# Patient Record
Sex: Female | Born: 1935 | Race: White | Hispanic: No | Smoking: Never smoker
Health system: Southern US, Community
[De-identification: ages and names within clinical notes are randomized; demographics above are authoritative.]

## PROBLEM LIST (undated history)

## (undated) DIAGNOSIS — I10 Essential (primary) hypertension: Secondary | ICD-10-CM

## (undated) DIAGNOSIS — J309 Allergic rhinitis, unspecified: Secondary | ICD-10-CM

## (undated) DIAGNOSIS — I6529 Occlusion and stenosis of unspecified carotid artery: Secondary | ICD-10-CM

## (undated) DIAGNOSIS — F0390 Unspecified dementia without behavioral disturbance: Secondary | ICD-10-CM

## (undated) DIAGNOSIS — F32A Depression, unspecified: Secondary | ICD-10-CM

## (undated) DIAGNOSIS — R0600 Dyspnea, unspecified: Secondary | ICD-10-CM

## (undated) DIAGNOSIS — I739 Peripheral vascular disease, unspecified: Secondary | ICD-10-CM

## (undated) DIAGNOSIS — M1990 Unspecified osteoarthritis, unspecified site: Secondary | ICD-10-CM

## (undated) DIAGNOSIS — F419 Anxiety disorder, unspecified: Secondary | ICD-10-CM

## (undated) DIAGNOSIS — F329 Major depressive disorder, single episode, unspecified: Secondary | ICD-10-CM

## (undated) DIAGNOSIS — J8410 Pulmonary fibrosis, unspecified: Secondary | ICD-10-CM

## (undated) DIAGNOSIS — E46 Unspecified protein-calorie malnutrition: Secondary | ICD-10-CM

## (undated) DIAGNOSIS — E785 Hyperlipidemia, unspecified: Secondary | ICD-10-CM

## (undated) DIAGNOSIS — K219 Gastro-esophageal reflux disease without esophagitis: Secondary | ICD-10-CM

## (undated) DIAGNOSIS — M069 Rheumatoid arthritis, unspecified: Secondary | ICD-10-CM

## (undated) DIAGNOSIS — M353 Polymyalgia rheumatica: Secondary | ICD-10-CM

## (undated) DIAGNOSIS — G629 Polyneuropathy, unspecified: Secondary | ICD-10-CM

## (undated) DIAGNOSIS — E559 Vitamin D deficiency, unspecified: Secondary | ICD-10-CM

## (undated) DIAGNOSIS — M6281 Muscle weakness (generalized): Secondary | ICD-10-CM

## (undated) DIAGNOSIS — K52831 Collagenous colitis: Secondary | ICD-10-CM

## (undated) DIAGNOSIS — H8110 Benign paroxysmal vertigo, unspecified ear: Secondary | ICD-10-CM

## (undated) DIAGNOSIS — L8990 Pressure ulcer of unspecified site, unspecified stage: Secondary | ICD-10-CM

## (undated) DIAGNOSIS — M8580 Other specified disorders of bone density and structure, unspecified site: Secondary | ICD-10-CM

---

## 1998-09-18 ENCOUNTER — Other Ambulatory Visit: Admission: RE | Admit: 1998-09-18 | Discharge: 1998-09-18 | Payer: Medicare PPO | Admitting: Family Medicine

## 1998-10-17 ENCOUNTER — Ambulatory Visit (HOSPITAL_COMMUNITY): Admission: RE | Admit: 1998-10-17 | Discharge: 1998-10-17 | Payer: Self-pay | Admitting: *Deleted

## 1999-10-17 ENCOUNTER — Other Ambulatory Visit: Admission: RE | Admit: 1999-10-17 | Discharge: 1999-10-17 | Payer: Self-pay | Admitting: Family Medicine

## 2002-02-10 ENCOUNTER — Encounter: Payer: Self-pay | Admitting: Family Medicine

## 2002-02-10 ENCOUNTER — Ambulatory Visit (HOSPITAL_COMMUNITY): Admission: RE | Admit: 2002-02-10 | Discharge: 2002-02-10 | Payer: Self-pay | Admitting: Cardiology

## 2002-03-01 ENCOUNTER — Encounter: Payer: Self-pay | Admitting: Critical Care Medicine

## 2002-03-03 ENCOUNTER — Ambulatory Visit (HOSPITAL_COMMUNITY): Admission: RE | Admit: 2002-03-03 | Discharge: 2002-03-03 | Payer: Self-pay | Admitting: Critical Care Medicine

## 2002-03-03 ENCOUNTER — Encounter: Payer: Self-pay | Admitting: Critical Care Medicine

## 2002-03-11 ENCOUNTER — Ambulatory Visit: Admission: RE | Admit: 2002-03-11 | Discharge: 2002-03-11 | Payer: Self-pay | Admitting: Critical Care Medicine

## 2002-03-11 ENCOUNTER — Encounter: Payer: Self-pay | Admitting: Critical Care Medicine

## 2002-04-05 ENCOUNTER — Encounter: Payer: Self-pay | Admitting: Critical Care Medicine

## 2005-02-10 ENCOUNTER — Ambulatory Visit: Payer: Self-pay | Admitting: Critical Care Medicine

## 2005-08-26 ENCOUNTER — Ambulatory Visit: Payer: Self-pay | Admitting: Physical Medicine & Rehabilitation

## 2005-08-26 ENCOUNTER — Inpatient Hospital Stay (HOSPITAL_COMMUNITY): Admission: RE | Admit: 2005-08-26 | Discharge: 2005-09-01 | Payer: Self-pay | Admitting: Orthopaedic Surgery

## 2005-09-01 ENCOUNTER — Inpatient Hospital Stay
Admission: RE | Admit: 2005-09-01 | Discharge: 2005-09-09 | Payer: Self-pay | Admitting: Physical Medicine & Rehabilitation

## 2005-12-17 ENCOUNTER — Ambulatory Visit: Payer: Self-pay | Admitting: Physical Medicine & Rehabilitation

## 2005-12-17 ENCOUNTER — Inpatient Hospital Stay (HOSPITAL_COMMUNITY): Admission: RE | Admit: 2005-12-17 | Discharge: 2005-12-22 | Payer: Self-pay | Admitting: Orthopedic Surgery

## 2005-12-22 ENCOUNTER — Inpatient Hospital Stay
Admission: RE | Admit: 2005-12-22 | Discharge: 2005-12-27 | Payer: Self-pay | Admitting: Physical Medicine & Rehabilitation

## 2006-07-01 ENCOUNTER — Ambulatory Visit: Payer: Self-pay | Admitting: Orthopedic Surgery

## 2006-07-01 ENCOUNTER — Inpatient Hospital Stay (HOSPITAL_COMMUNITY): Admission: AD | Admit: 2006-07-01 | Discharge: 2006-07-08 | Payer: Self-pay | Admitting: Orthopedic Surgery

## 2006-07-30 ENCOUNTER — Ambulatory Visit: Payer: Self-pay | Admitting: Infectious Diseases

## 2006-10-02 ENCOUNTER — Ambulatory Visit: Payer: Self-pay | Admitting: Infectious Diseases

## 2006-10-02 LAB — CONVERTED CEMR LAB
ALT: 18 units/L (ref 0–40)
AST: 20 units/L (ref 0–37)
Albumin: 4 g/dL (ref 3.5–5.2)
Alkaline Phosphatase: 97 units/L (ref 39–117)
BUN: 36 mg/dL — ABNORMAL HIGH (ref 6–23)
Basophils Absolute: 0 10*3/uL (ref 0.0–0.1)
Basophils percent auto: 0 % (ref 0–1)
CO2: 21 meq/L (ref 19–32)
Calcium: 9.3 mg/dL (ref 8.4–10.5)
Chloride: 106 meq/L (ref 96–112)
Creatinine, Ser: 0.91 mg/dL (ref 0.40–1.20)
Eosinophils Absolute: 0.1 cells/mcL (ref 0.0–0.7)
Eosinophils Relative: 2 % (ref 0–5)
Glucose, Bld: 112 mg/dL — ABNORMAL HIGH (ref 70–99)
HCT: 41.3 % (ref 36.0–46.0)
Hemoglobin: 13.4 g/dL (ref 12.0–15.0)
Leukocyte count, blood: 6.9 10*9/L (ref 4.0–10.5)
Lymphocytes Relative: 17 % (ref 12–46)
Lymphs Abs: 1.2 10*3/uL (ref 0.7–3.3)
MCHC: 32.4 g/dL (ref 30.0–36.0)
MCV: 86.2 fL (ref 78.0–100.0)
Monocytes Absolute: 0.6 10*3/uL (ref 0.2–0.7)
Monocytes Relative: 8 % (ref 3–11)
Neutro Abs: 5 10*3/uL (ref 1.7–7.7)
Neutrophils Relative %: 73 % (ref 43–77)
Platelets: 206 10*3/uL (ref 150–400)
Potassium: 4.6 meq/L (ref 3.5–5.3)
RBC: 4.79 M/uL (ref 3.87–5.11)
RDW: 14.8 % — ABNORMAL HIGH (ref 11.5–14.0)
Sed Rate: 8 mm/hr (ref 0–22)
Sodium: 139 meq/L (ref 135–145)
Total Bilirubin: 0.4 mg/dL (ref 0.3–1.2)
Total Protein: 6.5 g/dL (ref 6.0–8.3)

## 2007-01-06 ENCOUNTER — Encounter: Payer: Self-pay | Admitting: Infectious Diseases

## 2009-07-06 ENCOUNTER — Inpatient Hospital Stay (HOSPITAL_COMMUNITY): Admission: EM | Admit: 2009-07-06 | Discharge: 2009-07-16 | Payer: Self-pay | Admitting: Emergency Medicine

## 2009-07-10 ENCOUNTER — Encounter (INDEPENDENT_AMBULATORY_CARE_PROVIDER_SITE_OTHER): Payer: Self-pay | Admitting: Gastroenterology

## 2009-10-17 ENCOUNTER — Encounter: Payer: Self-pay | Admitting: Pulmonary Disease

## 2010-05-02 ENCOUNTER — Encounter: Payer: Self-pay | Admitting: Pulmonary Disease

## 2010-05-07 ENCOUNTER — Encounter: Payer: Self-pay | Admitting: Pulmonary Disease

## 2010-05-07 ENCOUNTER — Ambulatory Visit (HOSPITAL_COMMUNITY): Admission: RE | Admit: 2010-05-07 | Discharge: 2010-05-07 | Payer: Self-pay | Admitting: Internal Medicine

## 2010-05-14 ENCOUNTER — Encounter: Payer: Self-pay | Admitting: Pulmonary Disease

## 2010-05-31 DIAGNOSIS — E559 Vitamin D deficiency, unspecified: Secondary | ICD-10-CM | POA: Insufficient documentation

## 2010-05-31 DIAGNOSIS — E785 Hyperlipidemia, unspecified: Secondary | ICD-10-CM | POA: Insufficient documentation

## 2010-05-31 DIAGNOSIS — I10 Essential (primary) hypertension: Secondary | ICD-10-CM | POA: Insufficient documentation

## 2010-05-31 DIAGNOSIS — I6529 Occlusion and stenosis of unspecified carotid artery: Secondary | ICD-10-CM

## 2010-05-31 DIAGNOSIS — K219 Gastro-esophageal reflux disease without esophagitis: Secondary | ICD-10-CM | POA: Insufficient documentation

## 2010-05-31 DIAGNOSIS — M069 Rheumatoid arthritis, unspecified: Secondary | ICD-10-CM | POA: Insufficient documentation

## 2010-05-31 DIAGNOSIS — M949 Disorder of cartilage, unspecified: Secondary | ICD-10-CM

## 2010-05-31 DIAGNOSIS — F411 Generalized anxiety disorder: Secondary | ICD-10-CM | POA: Insufficient documentation

## 2010-05-31 DIAGNOSIS — J309 Allergic rhinitis, unspecified: Secondary | ICD-10-CM | POA: Insufficient documentation

## 2010-05-31 DIAGNOSIS — M899 Disorder of bone, unspecified: Secondary | ICD-10-CM | POA: Insufficient documentation

## 2010-06-03 ENCOUNTER — Ambulatory Visit: Payer: Self-pay | Admitting: Pulmonary Disease

## 2010-06-03 DIAGNOSIS — R05 Cough: Secondary | ICD-10-CM | POA: Insufficient documentation

## 2010-06-03 DIAGNOSIS — J8410 Pulmonary fibrosis, unspecified: Secondary | ICD-10-CM

## 2010-06-05 ENCOUNTER — Encounter: Payer: Self-pay | Admitting: Pulmonary Disease

## 2011-01-07 NOTE — Progress Notes (Signed)
Summary: [facility]   Imported By: Sherian Rein 06/06/2010 08:59:29  _____________________________________________________________________  External Attachment:    Type:   Image     Comment:   External Document

## 2011-01-07 NOTE — Letter (Signed)
Summary: [facility]  [facility]   Imported By: Sherian Rein 06/14/2010 09:46:19  _____________________________________________________________________  External Attachment:    Type:   Image     Comment:   External Document

## 2011-01-07 NOTE — Miscellaneous (Signed)
Summary: received CT Chest disc from [facility]  Clinical Lists Changes    received disc of pt's CT chest from [facility] and put in your very important look at folder for you to review.  Aundra Millet Reynolds LPN  June 05, 2010 4:14 PM   Appended Document: received CT Chest disc from [facility] ct reviewed.  the pt has very mild subpleural ISLD, and given her pfts with only mild abnormalities, I suspect this is not a major contributor to her doe or cough.  Will followup in 6mos to check on her progress. will call pt and discuss.  Appended Document: received CT Chest disc from [facility] discussed with pt...she knows to f/u in 6mos.

## 2011-01-07 NOTE — Progress Notes (Signed)
Summary: [facility]   Imported By: Sherian Rein 06/06/2010 08:57:39  _____________________________________________________________________  External Attachment:    Type:   Image     Comment:   External Document

## 2011-01-07 NOTE — Letter (Signed)
Summary: [facility]   Imported By: Sherian Rein 06/14/2010 09:47:20  _____________________________________________________________________  External Attachment:    Type:   Image     Comment:   External Document

## 2011-01-07 NOTE — Progress Notes (Signed)
Summary: [facility]   Imported By: Sherian Rein 06/06/2010 08:58:32  _____________________________________________________________________  External Attachment:    Type:   Image     Comment:   External Document

## 2011-01-07 NOTE — Assessment & Plan Note (Signed)
Summary: consult for cough and abnormal ct chest.   Copy to:  Buren Kos Primary Provider/Referring Provider:  Buren Kos  CC:  Pulmonary Consult.  History of Present Illness: The pt is an 77y/o female who I have been asked to see for cough and an abnormal ct chest.  She has been seen in 2003 by Dr Delford Field for upper airway issues related to GERD and AR, and treated for possible asthma without improvement.  She had a cxr/ct at this time which showed very mild fibrotic changes, and ANA/RF/ESR were all normal at that time.  Her pfts during this time showed no obstruction, no restriction, and a mild DLCO abnl.  All of her data has been reviewed from 2003.  The pt states that she has really had a chronic dry cough since that time, and can occasionally produce mucus that is clear in the evenings.  She is having ongoing postnasal drip, and has ongoing GERD which she thinks is well controlled on meds.  Again, inhalers in the past did nothing for the cough, and in fact she thinks worsened.  She has chronic doe which she thinks is stable from one year ago, and wonders if it is due to her age and debility.   She has had a recent ct chest which showed by report lower lobe predominant pulmonary fibrosis.  Her recent pfts showed no obstruction by spirometry, but did have mild airtrapping that mildly improved with bronchodilators.  Surprisingly, there was no restriction, and only mild reduction in DLCO that was similar to 2003.    Current Medications (verified): 1)  Hyzaar 100-25 Mg Tabs (Losartan Potassium-Hctz) .... Take 1 Tablet By Mouth Once A Day 2)  Norvasc 10 Mg Tabs (Amlodipine Besylate) .... Take 1 Tablet By Mouth Once A Day 3)  Zetia 10 Mg Tabs (Ezetimibe) .... Take 1 Tablet By Mouth Once A Day 4)  Vitamin D 1000 Unit Tabs (Cholecalciferol) .... Take 1 Tablet By Mouth Two Times A Day 5)  Aspirin 325 Mg Tabs (Aspirin) .... Take 1 Tablet By Mouth Once A Day 6)  Prevacid 24hr 15 Mg Cpdr (Lansoprazole)  .... Take 1 Tablet By Mouth Two Times A Day 7)  Aleve 220 Mg Tabs (Naproxen Sodium) .... Take 1 Tablet By Mouth Two Times A Day 8)  Xanax 0.5 Mg Tabs (Alprazolam) .... Take 1 Tablet By Mouth Three Times A Day As Needed 9)  Trazodone Hcl 100 Mg Tabs (Trazodone Hcl) .... Take 1 Tab By Mouth At Bedtime 10)  Cephalexin 500 Mg Caps (Cephalexin) .... Take 1 Tablet By Mouth Once A Day 11)  Vicodin 5-500 Mg Tabs (Hydrocodone-Acetaminophen) .... Take Every 6 Hours As Needed For Pain 12)  Levocetirizine Dihydrochloride 5 Mg Tabs (Levocetirizine Dihydrochloride) .... Take 1 Tablet By Mouth Once A Day As Needed  Allergies (verified): No Known Drug Allergies  Past History:  Past Medical History:  CAROTID STENOSIS (ICD-433.10) VITAMIN D DEFICIENCY (ICD-268.9) OSTEOPENIA (ICD-733.90) HYPERLIPIDEMIA (ICD-272.4) ALLERGIC RHINITIS (ICD-477.9) GERD (ICD-530.81) ANXIETY (ICD-300.00) HYPERTENSION (ICD-401.9) ARTHRITIS, RHEUMATOID (ICD-714.0) H/o collagenous colitis    Past Surgical History: R knee replacement  x 3 L knee replacement 2006 hysterectomy 1978 cervical spine fusion 1985 L eye surgery 1960 stripped veins in B legs 1960s tonsillectomy  Family History: Reviewed history from 05/31/2010 and no changes required. father deceased at age 30 MI mother deceased at age 30 CVA brother deceased at age 10 MI 5 sisters son deceaed at age 47 Non-Hodgkins Lymphoma son with aortic "clot" daughter with  RA 1 son and daughter healthy  Social History: Reviewed history from 05/31/2010 and no changes required. divorced widow daughter and son live with her pt ha 5 children (1 deceased) 4 grandchildren some community college retired Agricultural consultant.  never smoker.   Review of Systems       The patient complains of shortness of breath with activity, productive cough, non-productive cough, loss of appetite, and anxiety.  The patient denies shortness of breath at rest, coughing  up blood, chest pain, irregular heartbeats, acid heartburn, indigestion, weight change, abdominal pain, difficulty swallowing, sore throat, tooth/dental problems, headaches, nasal congestion/difficulty breathing through nose, sneezing, itching, ear ache, depression, hand/feet swelling, joint stiffness or pain, rash, change in color of mucus, and fever.    Vital Signs:  Patient profile:   76 year old female Height:      62 inches Weight:      168 pounds BMI:     30.84 O2 Sat:      96 % on Room air Temp:     98.4 degrees F oral Pulse rate:   103 / minute BP sitting:   130 / 78  (right arm) Cuff size:   regular  Vitals Entered By: Arman Filter LPN (June 03, 2010 1:50 PM)  O2 Flow:  Room air CC: Pulmonary Consult Comments Medications reviewed with patient Arman Filter LPN  June 03, 2010 1:50 PM    Physical Exam  General:  ow female in nad Eyes:  PERRLA and EOMI.   Nose:  patent without discharge Mouth:  clear Neck:  no jvd, tmg, LN Lungs:  bibasilar crackles 1/3 up bilat, no wheezing or rhonchi Heart:  rrr, no mrg Abdomen:  soft and nontender, bs+ Extremities:  2+ edema left worse than right, venous stasis changes, no cyanosis pulses intact distally but diminished Neurologic:  alert and oriented, moves all 4.   Impression & Recommendations:  Problem # 1:  PULMONARY FIBROSIS, CHRONIC (ICD-515) the pt has a h/o some degree of ISLD dating back to 2003.  Her recent ct report describes the classic findings of UIP, but I will need to review for myself once disc available.  The most likely etiology for this is her RA, and the question is whether it is worth putting her thru more aggressive treatment.  She is frail and debilitated, and her pfts are surprisingly normal except for a mild decrease in dlco.  The pt has chronic doe, and tells me that she does not think it is any worse than one year ago.  At this point, I would like to review her ct chest, then discuss with her further.  My  inclination however, is to just follow this to see if she has progressive pulmonary symptoms, radiographs, or pfts.  Problem # 2:  COUGH, CHRONIC (ICD-786.2) this has been a longstanding issue for her of many years duration.  She clearly has issues with postnasal drip and reflux, as well as a hypersensitized upper airway.  She did not respond to treatment for possible asthma, and her current pfts show minimal obstruction primarily manifested as airtrapping ( I suspect this is senile emphysema).  Will try on more aggressive treatment for postnasal drip and see if she sees improvement.  Certainly ISLD can cause cough, but typically when it is more advanced.  Will be able to comment on this once current ct scan reviewed.  Medications Added to Medication List This Visit: 1)  Xanax 0.5 Mg Tabs (Alprazolam) .... Take 1  tablet by mouth three times a day as needed 2)  Levocetirizine Dihydrochloride 5 Mg Tabs (Levocetirizine dihydrochloride) .... Take 1 tablet by mouth once a day as needed  Other Orders: Consultation Level V (60454)  Patient Instructions: 1)  will get disc of your ct chest [facility] and call you. 2)  try chlorpheniramine 8mg  at bedtime in the place of your levocetirizine for a few weeks to see if works better for postnasal drip. 3)  stay on medication for reflux disease. 4)  will arrange followup once I have reviewed your xrays.

## 2011-03-15 LAB — BASIC METABOLIC PANEL
BUN: 12 mg/dL (ref 6–23)
BUN: 5 mg/dL — ABNORMAL LOW (ref 6–23)
BUN: 8 mg/dL (ref 6–23)
BUN: 9 mg/dL (ref 6–23)
CO2: 23 mEq/L (ref 19–32)
CO2: 25 mEq/L (ref 19–32)
CO2: 25 mEq/L (ref 19–32)
CO2: 28 mEq/L (ref 19–32)
Calcium: 9 mg/dL (ref 8.4–10.5)
Calcium: 9 mg/dL (ref 8.4–10.5)
Calcium: 9.9 mg/dL (ref 8.4–10.5)
Chloride: 104 mEq/L (ref 96–112)
Chloride: 99 mEq/L (ref 96–112)
Creatinine, Ser: 0.62 mg/dL (ref 0.4–1.2)
Creatinine, Ser: 0.76 mg/dL (ref 0.4–1.2)
Creatinine, Ser: 0.89 mg/dL (ref 0.4–1.2)
GFR calc Af Amer: >60 mL/min (ref >60)
GFR calc Af Amer: >60 mL/min (ref >60)
GFR calc Af Amer: >60 mL/min (ref >60)
GFR calc Af Amer: >60 mL/min (ref >60)
GFR calc Af Amer: >60 mL/min (ref >60)
GFR calc non Af Amer: >60 mL/min (ref >60)
GFR calc non Af Amer: >60 mL/min (ref >60)
GFR calc non Af Amer: >60 mL/min (ref >60)
GFR calc non Af Amer: >60 mL/min (ref >60)
GFR calc non Af Amer: >60 mL/min (ref >60)
Glucose, Bld: 103 mg/dL — ABNORMAL HIGH (ref 70–99)
Glucose, Bld: 105 mg/dL — ABNORMAL HIGH (ref 70–99)
Glucose, Bld: 86 mg/dL (ref 70–99)
Glucose, Bld: 92 mg/dL (ref 70–99)
Potassium: 3.8 mEq/L (ref 3.5–5.1)
Potassium: 4.3 mEq/L (ref 3.5–5.1)
Potassium: 4.4 mEq/L (ref 3.5–5.1)
Potassium: 4.4 mEq/L (ref 3.5–5.1)
Sodium: 133 mEq/L — ABNORMAL LOW (ref 135–145)
Sodium: 134 mEq/L — ABNORMAL LOW (ref 135–145)
Sodium: 135 mEq/L (ref 135–145)

## 2011-03-15 LAB — CBC
HCT: 34.1 % — ABNORMAL LOW (ref 36.0–46.0)
HCT: 34.4 % — ABNORMAL LOW (ref 36.0–46.0)
HCT: 34.6 % — ABNORMAL LOW (ref 36.0–46.0)
HCT: 35.8 % — ABNORMAL LOW (ref 36.0–46.0)
HCT: 37 % (ref 36.0–46.0)
Hemoglobin: 11.7 g/dL — ABNORMAL LOW (ref 12.0–15.0)
Hemoglobin: 11.8 g/dL — ABNORMAL LOW (ref 12.0–15.0)
Hemoglobin: 11.9 g/dL — ABNORMAL LOW (ref 12.0–15.0)
Hemoglobin: 11.9 g/dL — ABNORMAL LOW (ref 12.0–15.0)
Hemoglobin: 12.1 g/dL (ref 12.0–15.0)
Hemoglobin: 12.6 g/dL (ref 12.0–15.0)
MCHC: 34.1 g/dL (ref 30.0–36.0)
MCHC: 34.2 g/dL (ref 30.0–36.0)
MCHC: 34.3 g/dL (ref 30.0–36.0)
MCHC: 34.4 g/dL (ref 30.0–36.0)
MCHC: 35.3 g/dL (ref 30.0–36.0)
MCV: 90.1 fL (ref 78.0–100.0)
MCV: 90.3 fL (ref 78.0–100.0)
MCV: 90.6 fL (ref 78.0–100.0)
MCV: 91.6 fL (ref 78.0–100.0)
Platelets: 182 10*3/uL (ref 150–400)
Platelets: 188 10*3/uL (ref 150–400)
Platelets: 193 10*3/uL (ref 150–400)
Platelets: 225 10*3/uL (ref 150–400)
Platelets: 229 10*3/uL (ref 150–400)
RBC: 3.77 MIL/uL — ABNORMAL LOW (ref 3.87–5.11)
RBC: 3.78 MIL/uL — ABNORMAL LOW (ref 3.87–5.11)
RBC: 3.8 MIL/uL — ABNORMAL LOW (ref 3.87–5.11)
RBC: 3.97 MIL/uL (ref 3.87–5.11)
RBC: 4.09 MIL/uL (ref 3.87–5.11)
RDW: 12.4 % (ref 11.5–15.5)
RDW: 12.6 % (ref 11.5–15.5)
RDW: 12.6 % (ref 11.5–15.5)
RDW: 12.7 % (ref 11.5–15.5)
RDW: 12.8 % (ref 11.5–15.5)
RDW: 13.1 % (ref 11.5–15.5)
RDW: 13.1 % (ref 11.5–15.5)
WBC: 4.7 10*3/uL (ref 4.0–10.5)
WBC: 4.9 10*3/uL (ref 4.0–10.5)
WBC: 4.9 10*3/uL (ref 4.0–10.5)

## 2011-03-15 LAB — COMPREHENSIVE METABOLIC PANEL WITH GFR
ALT: 12 U/L (ref 0–35)
AST: 17 U/L (ref 0–37)
Albumin: 2.6 g/dL — ABNORMAL LOW (ref 3.5–5.2)
Alkaline Phosphatase: 42 U/L (ref 39–117)
BUN: 4 mg/dL — ABNORMAL LOW (ref 6–23)
CO2: 27 meq/L (ref 19–32)
Calcium: 8.8 mg/dL (ref 8.4–10.5)
Chloride: 102 meq/L (ref 96–112)
Creatinine, Ser: 0.71 mg/dL (ref 0.4–1.2)
GFR calc non Af Amer: >60 mL/min
Glucose, Bld: 91 mg/dL (ref 70–99)
Potassium: 3.4 meq/L — ABNORMAL LOW (ref 3.5–5.1)
Sodium: 134 meq/L — ABNORMAL LOW (ref 135–145)
Total Bilirubin: 0.6 mg/dL (ref 0.3–1.2)
Total Protein: 5 g/dL — ABNORMAL LOW (ref 6.0–8.3)

## 2011-03-15 LAB — BASIC METABOLIC PANEL WITH GFR
BUN: 2 mg/dL — ABNORMAL LOW (ref 6–23)
CO2: 27 meq/L (ref 19–32)
Calcium: 9.1 mg/dL (ref 8.4–10.5)
Chloride: 102 meq/L (ref 96–112)
Creatinine, Ser: 0.66 mg/dL (ref 0.4–1.2)
GFR calc non Af Amer: >60 mL/min
Glucose, Bld: 95 mg/dL (ref 70–99)
Potassium: 3.4 meq/L — ABNORMAL LOW (ref 3.5–5.1)
Sodium: 135 meq/L (ref 135–145)

## 2011-03-15 LAB — TISSUE TRANSGLUTAMINASE, IGA: Tissue Transglutaminase Ab, IgA: 0 U/mL (ref <7)

## 2011-03-15 LAB — CLOSTRIDIUM DIFFICILE EIA

## 2011-03-15 LAB — ENDOMYSIAL IGA ANTIBODY: Endomysial IgA Autoabs: <1:10 {titer}

## 2011-03-16 LAB — CBC
HCT: 35.4 % — ABNORMAL LOW (ref 36.0–46.0)
HCT: 39.2 % (ref 36.0–46.0)
Hemoglobin: 13.5 g/dL (ref 12.0–15.0)
MCHC: 34.2 g/dL (ref 30.0–36.0)
MCV: 90.2 fL (ref 78.0–100.0)
MCV: 91 fL (ref 78.0–100.0)
Platelets: 219 10*3/uL (ref 150–400)
Platelets: 266 10*3/uL (ref 150–400)
RDW: 12.2 % (ref 11.5–15.5)
WBC: 5.9 10*3/uL (ref 4.0–10.5)

## 2011-03-16 LAB — POCT I-STAT, CHEM 8
BUN: 18 mg/dL (ref 6–23)
Calcium, Ion: 1.23 mmol/L (ref 1.12–1.32)
Creatinine, Ser: 1 mg/dL (ref 0.4–1.2)
Glucose, Bld: 145 mg/dL — ABNORMAL HIGH (ref 70–99)
Hemoglobin: 13.9 g/dL (ref 12.0–15.0)
TCO2: 27 mmol/L (ref 0–100)

## 2011-03-16 LAB — DIFFERENTIAL
Eosinophils Absolute: 0 10*3/uL (ref 0.0–0.7)
Eosinophils Relative: 0 % (ref 0–5)
Lymphocytes Relative: 13 % (ref 12–46)
Lymphs Abs: 0.7 10*3/uL (ref 0.7–4.0)
Monocytes Absolute: 0.5 10*3/uL (ref 0.1–1.0)

## 2011-03-16 LAB — HEPATIC FUNCTION PANEL
AST: 26 U/L (ref 0–37)
Albumin: 3.4 g/dL — ABNORMAL LOW (ref 3.5–5.2)
Alkaline Phosphatase: 59 U/L (ref 39–117)
Total Bilirubin: 0.5 mg/dL (ref 0.3–1.2)

## 2011-03-16 LAB — CLOSTRIDIUM DIFFICILE EIA: C difficile Toxins A+B, EIA: NEGATIVE

## 2011-03-16 LAB — URINALYSIS, ROUTINE W REFLEX MICROSCOPIC
Bilirubin Urine: NEGATIVE
Nitrite: NEGATIVE
Specific Gravity, Urine: 1.01 (ref 1.005–1.030)
Urobilinogen, UA: 0.2 mg/dL (ref 0.0–1.0)

## 2011-03-16 LAB — URINE MICROSCOPIC-ADD ON

## 2011-03-16 LAB — GIARDIA/CRYPTOSPORIDIUM SCREEN(EIA)

## 2011-03-16 LAB — BASIC METABOLIC PANEL
BUN: 9 mg/dL (ref 6–23)
Chloride: 96 mEq/L (ref 96–112)
Glucose, Bld: 92 mg/dL (ref 70–99)
Potassium: 3.7 mEq/L (ref 3.5–5.1)

## 2011-03-16 LAB — FECAL LACTOFERRIN, QUANT: Fecal Lactoferrin: POSITIVE

## 2011-03-16 LAB — STOOL CULTURE

## 2011-04-22 NOTE — Discharge Summary (Signed)
[name], [name]               ACCOUNT NO.:  [id number]   MEDICAL RECORD NO.:  [id number]          PATIENT TYPE:  INP   LOCATION:  5509                         FACILITY:  [facility]   PHYSICIAN:  Kari Baars, M.D.  DATE OF BIRTH:  02/26/1927   DATE OF ADMISSION:  07/06/2009  DATE OF DISCHARGE:                               DISCHARGE SUMMARY   DISCHARGE DIAGNOSES:  1. Acute on chronic diarrhea secondary to collagenase colitis.  2. Dehydration secondary to #1.  3. Weight loss secondary to #1.  4. Rheumatoid arthritis.  5. Status post right total knee replacement (1996) with revision in      January 2007 (complicated by postoperative infection).  Status post      incision and drainage (July 2007) followed by IV antibiotics and      chronic suppressive Keflex.  6. Hypertension.  7. Hyperlipidemia.  8. Osteopenia with vitamin D deficiency.  9. Allergic rhinitis  10.Gastroesophageal reflux disease.  11.Anxiety disorder.  12.Status post left total knee replacement.  13.Status post partial hysterectomy (1970).  14.Status post C-spine fusion.  15.Status post left eye surgery.   DISCHARGE MEDICATIONS:  1. Entocort 9 mg daily.  2. Norvasc 10 mg daily.  3. Aspirin 325 mg daily.  4. Keflex 250 mg daily for suppression of right total knee replacement      infection, continue indefinitely.  5. Catapres 0.1 mg b.i.d.  6. Micardis HCT 80/25 mg daily.  7. Prevacid OTC 20 mg b.i.d.  8. K-Dur 20 mEq daily.  9. Trazodone 100 mg every night.  10.Xanax 0.5 mg q.8 h. p.r.n. anxiety.  11.Kaopectate 30 mL q.6 h. p.r.n. mild to moderate diarrhea.  12.Imodium 1 p.o. q.8 h. p.r.n. severe diarrhea.  13.Zetia 10 mg daily.  14.Multivitamin daily.  15.Calcium 600/vitamin D 400 units 1 p.o. b.i.d.   She was encouraged to discontinue use of all her other multiple herbal  supplements to simplify her medical regimen at this point.   HOSPITAL PROCEDURES:  1. Esophagogastroduodenoscopy (July 10, 2009), normal endoscopy.  2. Colonoscopy (July 10, 2009), biopsies consistent with microscopic      colitis (collagenase colitis), otherwise normal with no gross      abnormalities.   HOSPITAL CONSULTS:  Gastroenterology (Dr. Charlott Rakes and Dr. Dorena Cookey).   HISTORY OF PRESENT ILLNESS:  For full details, please see dictated  history and physical.  Briefly, [name] is an 77 year old white  female with a history of rheumatoid arthritis, status post bilateral  total knee replacements with chronic right knee replacement infection on  suppressive antibiotics who presented to the emergency department on  July 30 with severe diarrhea for the past 3-4 weeks.  She describes a  more chronic pattern of diarrhea over the past several years which she  relates to the use of Keflex following her total knee replacement  surgery in July 2007.  She states that she will stop the Keflex for  short periods of time, and it seems to improve, and then when she starts  it back it gets worse.  However, over the past 3 or  4 weeks, she has had  a dramatic increase in diarrhea with multiple (up to 8 or 9) bowel  movements per day which are watery.  She has had fecal incontinence.  She stopped her Keflex 2 weeks ago without any improvement.  In fact,  her symptoms have continued to worsen, and she has had a significant  amount of weight loss, 20-30 pounds.  She was seen in our office on July  12 at the beginning of her symptoms and felt that it could be viral.  She was treated with probiotics and antidiarrheals without any  improvement.  Flagyl was added on July 19, and stool studies were  ordered but apparently lost by the referral lab.  She had no improvement  with the Flagyl and presented to the emergency department on July 30  with persistent symptoms and some confusion and dehydration.  In the  emergency department, she was afebrile with blood pressure of 133/51,  pulse 80 and oxygen saturation  100%.  She was lethargic and fatigued on  presentation but improved with some fluids in the emergency room.  CBC  showed a white count of 5.9, BUN 18, creatinine 1.0 and potassium 3.6.  Given her severe diarrhea, dehydration, and weight loss, she was  admitted for further management.   HOSPITAL COURSE:  The patient was admitted to a medical bed.  She was  initially placed on isolation precautions, and stool studies were  obtained for clostridium difficile, O and P, culture, Giardia, and fecal  fat.  These studies were negative with clostridium difficile toxin  negative x2.  She was placed on empiric vancomycin on admission pending  these results which has since been discontinued without any significant  improvement.  Gastroenterology was consulted, and the patient underwent  an upper endoscopy for small bowel biopsies and colonoscopy which was  grossly normal.  However, biopsies revealed microscopic colitis  consistent with collagenous colitis.  Therefore, Dr. Madilyn Fireman recommended  initiation of Entocort 9 mg daily which she initiated 2 days prior to  discharge.  With her bowel prep and now steroid therapy, her diarrhea  has improved dramatically down from 8 bowel movements a day to 2 formed  bowel movements on the day prior to discharge.  She feels much better.  In addition, her medical regimen has been simplified for discontinuation  of multiple supplements.  I have restarted her Keflex for her chronic  right knee infection due to the risk of recurrent infection while on  steroid therapy and also to minimize any new medication changes.  If in  fact her diarrhea does worsen with the Keflex, alternative should be  discussed with Dr. Turner Daniels (orthopedics) or infectious disease.  At this  point, the patient is stable for discharge from the hospital.  However,  she feels too weak to return home and feels that she needs inpatient  rehab.  Arrangements have been made for discharge to [facility]  pending bed  availability.   DISCHARGE DIET:  Cardiac prudent.   DISCHARGE INSTRUCTIONS:  She should continue physical therapy,  occupational therapy and current medical regimen.  Will obtain a BMET in  1 week.   DISCHARGE LABORATORY DATA:  CBC shows a white count of 5.4, hemoglobin  11.9, platelets 187.  BMET significant for sodium 135, potassium 4.4,  chloride 104, bicarb 25, BUN 10, creatinine 0.7, glucose 105.  Anti-  endomysial and tissue transglutaminase antibodies were negative for  celiac disease.  Stool culture was negative.  Clostridium difficile  negative x2.  Hemoccult negative.  Giardia  negative.  Fecal lactoferrin positive.  Sedimentation rate 40.  TSH  0.69.   DISPOSITION:  To skilled nursing facility rehab pending bed  availability.      Kari Baars, M.D.  Electronically Signed     WS/MEDQ  D:  07/13/2009  T:  07/13/2009  Job:  [id number]   cc:   Everardo All. Madilyn Fireman, M.D.

## 2011-04-22 NOTE — H&P (Signed)
NAMEAPRILE, [name]               ACCOUNT NO.:  [id number]   MEDICAL RECORD NO.:  [id number]          PATIENT TYPE:  INP   LOCATION:  5509                         FACILITY:  [facility]   PHYSICIAN:  Kari Baars, M.D.  DATE OF BIRTH:  [birth year]   DATE OF ADMISSION:  07/06/2009  DATE OF DISCHARGE:                              HISTORY & PHYSICAL   CHIEF COMPLAINT:  Diarrhea and dehydration.   HISTORY OF PRESENT ILLNESS:  [name] is an 77 year old white female  with a history of rheumatoid arthritis, chronic right total knee  replacement infection on suppressive antibiotics who presented to the  emergency department with severe diarrhea for the past 3-4 weeks.  The  patient reports chronic diarrhea consisting of 1-2 bowel movements per  day since she had her right total knee replacement in July 2007, which  she has attributed to Keflex.  She states that the diarrhea gets better  if she stops the Keflex for short period of time and then resumes after  she restarts it.  She takes Imodium frequently and her daughter states  that she is going through multiple bottles of Imodium for prevention of  diarrhea; however, over the past 3-4 weeks, she has experienced a  significant increase in diarrhea with uncontrollable loose watery  stools.  She stopped her Keflex 2 weeks ago without relief.  She was  actually seen in our office on June 18, 2009, for these symptoms, which  at that time had been going on for about a week.  She was recommended to  take probiotics and antidiarrheals with no improvement.  Therefore,  Flagyl was added on June 25, 2009, at 500 mg q.8 h.  She states that she  may have had some initial improvement, but her symptoms have now  worsened with multiple uncontrollable loose stools per day.  She is now  having at least 5-6 bowel movements.  She called our office today and  was given an office visit, but was having too much incontinence to come  to the office and was  becoming disoriented.  She was referred to the  emergency department.  She denies any abdominal pain.  No fevers,  chills, or sweats.  No nausea or vomiting.  No other recent travel or  antibiotics other than the Keflex, well water.  No sick contacts.  Of  note, stool studies were ordered on June 25, 2009, and she states that  she brought stool sample in, but that this was apparently lost.  I have  checked with our lab and referral labs with no documentation of stool  collection.   REVIEW OF SYSTEMS:  All systems reviewed with the patient are negative  except as in HPI with the following exceptions:  She has chronic joint  pain, most prominent in her bilateral knees.  No recent change and she  has been off the antibiotic.   PAST MEDICAL HISTORY:  1. Rheumatoid arthritis.  2. Hypertension.  3. Hyperlipidemia.  4. Osteopenia with vitamin D deficiency.  5. Allergic rhinitis.  6. Gastroesophageal reflux disease.  7. Anxiety disorder.  8.  Status post right total knee replacement in 1996 with revision in      January 2008 complicated by postoperative infection.  Underwent      incision and drainage in July 2007 and has been on chronic Keflex      following IV antibiotics.  9. Status post left total knee replacement (September 2006).  10.Status post partial hysterectomy (1970).  11.Status post C-spine fusion.  12.Status post left eye surgery.   CURRENT MEDICATIONS:  1. Zetia 10 mg daily.  2. Micardis HCT 80/25 daily.  3. Norvasc 5 mg daily.  4. Prevacid OTC 20 mg b.i.d.  5. Xanax 0.5 mg daily p.r.n.  6. Vicodin p.r.n.  7. Aspirin 325 mg daily.  8. Trazodone 50 mg at bedtime.   Multiple supplements, which she uses erratically and is unable to  determine whether she has taken them recently or not.  These include,  1. Echinacea.  2. Fish oil.  3. Calcium/vitamin D.  4. Cinnamon.  5. St. John's Wort.  6. Niacin.  7. B12.  8. Glucosamine.  9. Acidophilus  10.Gingko biloba   11.Benadryl.  12.Chromium picolinate.  13.Aleve.   ALLERGIES:  ATENOLOL causes arm pain.   SOCIAL HISTORY:  She is a widow.  She has 5 children including 2  daughters that are with her currently.  [name] helps with her care  primarily.  She is retired from Technical sales engineer.  No tobacco,  alcohol, or drug use.   FAMILY HISTORY:  Father died of an MI at 55.  Mother died of a stroke at  72.  Brother died of an MI at 67.  She has a sister with cirrhosis.  Son  died of non-Hodgkin lymphoma.  Her daughter has severe rheumatoid  arthritis.   PHYSICAL EXAMINATION:  VITAL SIGNS:  Temperature 98.1, blood pressure  133/51 initially, 151/76 currently, pulse 80 initially and 65 currently,  respirations 16, oxygen saturation 100% on room air.  GENERAL:  Pleasant female in no acute distress.  A bit evasive with her  answers.  HEENT:  Her dentures are loose.  Oropharynx is moist.  No scleral  icterus.  NECK:  Supple without lymphadenopathy or JVD.  HEART:  Regular rate and rhythm without murmurs, rubs, or gallops.  LUNGS:  Clear to auscultation bilaterally.  ABDOMEN:  Soft, nondistended, nontender with normoactive bowel sounds.  EXTREMITIES:  No clubbing, cyanosis, or edema.  She does have bilateral  knee crepitus with significant decrease in range of motion.  Minimal  right knee effusion with no warmth or erythema.  NEUROLOGIC:  Alert and oriented x4.   LABORATORY DATA:  CBC shows a white count of 5.9, hemoglobin 13.5,  platelets 266.  BMET significant for sodium 129, potassium 3.6, chloride  93, bicarb 27, BUN 18, creatinine 1.0, glucose 145.   ASSESSMENT/PLAN:  1. Acute on chronic diarrhea - her history is concerning for C. diff      colitis due to prolonged Keflex use though the absence of      leukocytosis would be atypical.  Differential diagnosis includes      polypharmacy secondary to multiple herbal remedies, irritable bowel      syndrome, colitis (infectious versus inflammatory  versus      collagenous).  We will obtain stool studies for C. diff, ova and      parasites, Giardia, culture, fecal fat, and white blood cells.  We      will start empiric vancomycin pending stool studies given her  failure with Flagyl.  We will consider GI consult for endoscopic      evaluation as the patient never underwent her colonoscopy, which      was recommended at her physical in September 2009.  Her last      colonoscopy was well over 10 years ago.  2. Chronic right knee, total knee replacement - the role of      suppressive antibiotics, particularly Keflex is unclear at this      point.  We will hold for now and consider Orthopedics consult      versus Infectious Disease consult to guide other treatment options      given her chronic diarrhea, which may be related to Keflex.  We      will obtain a sed rate and CRP to help determine course, although      these are likely to be elevated due to her underlying rheumatoid      arthritis.  3. Polypharmacy - we will hold all nonessential medications and      supplements.  4. Hypertension - continue Micardis and Norvasc.  Hold her      hydrochlorothiazide due to dehydration.  5. Hyponatremia - hold hydrochlorothiazide and monitor.  6. Deep venous thrombosis prophylaxis with Lovenox.  7. Disposition - anticipate discharge to home in 3-4 days when she is      stable.      Kari Baars, M.D.  Electronically Signed     WS/MEDQ  D:  07/06/2009  T:  07/07/2009  Job:  [id number]   cc:   Feliberto Gottron. Turner Daniels, M.D.

## 2011-04-22 NOTE — Group Therapy Note (Signed)
NAMEZIARE, ORRICK               ACCOUNT NO.:  [id number]   MEDICAL RECORD NO.:  [id number]          PATIENT TYPE:  INP   LOCATION:  5509                         FACILITY:  [facility]   PHYSICIAN:  John C. Madilyn Fireman, M.D.    DATE OF BIRTH:  [birth year]                                 PROGRESS NOTE   PROCEDURE PERFORMED:  Esophagogastroduodenoscopy on an see carried 890-33 units number  [id number].   INDICATIONS FOR PROCEDURE:  Chronic diarrhea with empiric trials of antibiotics and stool study  workup negative to date.   PROCEDURE:  The patient was placed in the left lateral decubitus position and placed  on the pulse monitor with continuous low-flow oxygen delivered by nasal  cannula.  She was sedated with 50 mcg IV fentanyl and 5 mg IV Versed.  The Olympus video endoscope was advanced under direct vision into the  oropharynx and esophagus.  The esophagus was straight and of normal  caliber.  The  squamocolumnar line at 38 cm.  There was no visible  hiatal hernia, ring, stricture or other abnormality of the GE junction.  The stomach was entered and small amount of liquid secretions were  suctioned from the fundus.  Retroflexed view of cardia was unremarkable.  The fundus, body, antrum and pylorus all appeared normal.  The duodenum  was entered and both bulb and second portion were well inspected and  appeared to be within normal limits.  Biopsies were taken of the second  portion and bulb to rule out celiac disease or any other sprue like  process.  The scope was then withdrawn and the patient returned to the  recovery room in stable condition.  She tolerated the procedure well.  There were no immediate complications.   IMPRESSION:  1. Normal endoscopy, status post biopsies of the duodenum.   PLAN:  Await histology and will also proceed with colonoscopy as scheduled.           ______________________________  Everardo All Madilyn Fireman, M.D.     JCH/MEDQ  D:  07/10/2009  T:  07/10/2009  Job:   [id number]   cc:   Kari Baars, M.D.  Fax: [phone]

## 2011-04-22 NOTE — Op Note (Signed)
[name], [name]               ACCOUNT NO.:  [id number]   MEDICAL RECORD NO.:  [id number]          PATIENT TYPE:  INP   LOCATION:  5509                         FACILITY:  [facility]   PHYSICIAN:  John C. Madilyn Fireman, M.D.    DATE OF BIRTH:  [birth year]   DATE OF PROCEDURE:  07/10/2009  DATE OF DISCHARGE:                               OPERATIVE REPORT   INDICATIONS FOR PROCEDURE:  Chronic diarrhea with negative workup to  date.   PROCEDURE:  The patient was placed in the left lateral decubitus  position and placed on the pulse monitor with continuous low-flow oxygen  delivered by nasal cannula.  She was sedated with 50 mcg IV fentanyl and  5 mg IV Versed in addition to the medicine given for the previous EGD.  The Olympus video colonoscope was inserted into the rectum and advanced  to about the ileocecal valve.  The colon was long and tortuous with a  lot of looping and I could not inspect the base of the cecum; otherwise,  the visualized portions of the cecum as well as the ascending and  transverse colon appeared normal with no masses, polyps, diverticula or  other mucosal abnormalities.  In the descending and sigmoid colon there  was seen a few scattered diverticula, no other abnormalities.  The  rectum appeared normal.  On retroflexed view, the anus revealed no  obvious internal hemorrhoids.  Biopsies were taken of the ascending,  transverse and descending colon to rule out microscopic or collagenase  colitis.  The scope was then withdrawn and the patient returned to the  recovery room in stable condition.  She tolerated the procedure well.  There were no immediate complications.   IMPRESSION:  Diverticulosis, otherwise normal study.  No visible  evidence of colitis.   PLAN:  Await biopsies of colon as well as the duodenum and will leave  further recommendations.           ______________________________  Everardo All Madilyn Fireman, M.D.     JCH/MEDQ  D:  07/10/2009  T:  07/10/2009  Job:   [id number]   cc:   Kari Baars, M.D.  Fax: [phone]

## 2011-04-22 NOTE — Consult Note (Signed)
[name], [name]               ACCOUNT NO.:  [id number]   MEDICAL RECORD NO.:  [id number]          PATIENT TYPE:  INP   LOCATION:  5509                         FACILITY:  [facility]   PHYSICIAN:  Shirley Friar, MDDATE OF BIRTH:  04/17/1927   DATE OF CONSULTATION:  DATE OF DISCHARGE:                                 CONSULTATION   REQUESTING PHYSICIAN:  Dr. Clelia Croft   INDICATIONS:  Diarrhea.   HISTORY OF PRESENT ILLNESS:  [name] is a pleasant 77 year old white  female with profuse watery diarrhea x4 weeks that has failed to respond  to a 2-week course of Flagyl, chronic Imodium, and use of probiotics.  She has been on chronic Keflex since 2007 reportedly because of a right  total knee replacement.  She denies any associated nausea, vomiting or  abdominal pain other than some mild cramping with the  diarrhea.  She  denies any rectal bleeding or blood with the diarrhea.  She has C. diff  toxin negative x1.  Positive lactoferrin and negative stool culture to  date.  She is currently on p.o. vancomycin and has continued to have 7-8  watery loose stools per day.  She reports having a colonoscopy over 10  years ago which she says was normal and denies any history of polyps.   PAST MEDICAL HISTORY:  1. Rheumatoid arthritis.  2. Hypertension.  3. Hyperlipidemia.  4. Osteopenia.  5. Allergic rhinitis.  6. GERD.  7. Anxiety disorder.   CURRENT MEDICATIONS:  Norvasc, aspirin, Catapres, Lovenox, Benicar,  Protonix, K-Dur, trazodone and p.o. vancomycin.   ALLERGIES:  No known drug allergies.   FAMILY HISTORY:  Noncontributory.   SOCIAL HISTORY:  Denies alcohol, drugs or tobacco, widowed.   REVIEW OF SYSTEMS:  Negative from GI standpoint except as stated above.   PHYSICAL EXAMINATION:  VITAL SIGNS:  Temperature 98.3, pulse 60, blood  pressure 181/67, O2 sats 96% on room air.  GENERAL:  Alert in no acute distress.  ABDOMEN:  Soft, nontender, nondistended.  Positive bowel  sounds.   LABORATORY DATA:  White blood count 4.9, hemoglobin 12.6, platelet count  229.  All other labs as noted in hospital records.   IMPRESSION:  An 77 year old white female with chronic watery diarrhea  who has failed outpatient treatment with Flagyl and is currently failing  treatment with p.o. vancomycin.  The patient feels like her diarrhea has  slightly subsided overnight, although she continues to have persistent  diarrhea during this hospitalization.  Differential includes antibiotic-  induced diarrhea unrelated to Clostridium difficile versus microscopic  colitis versus toxin negative pseudomembranous colitis.  Celiac disease  is unlikely, if onset is within the past 4 weeks has she claims but  reasonable to check.  The patient is due for screening colonoscopy  anyway and will need biopsies to help figure out the source of diarrhea.  She also may need an upper endoscopy with duodenal biopsies to look for  villous atrophy.  We will prep today with MiraLax for colonoscopy and  possible upper endoscopy on July 10, 2009.      Shirley Friar, MD  Electronically Signed     VCS/MEDQ  D:  07/09/2009  T:  07/10/2009  Job:  [id number]   cc:   Kari Baars, M.D.

## 2011-04-25 NOTE — H&P (Signed)
NAMEMarland Kitchen  [name], [name] NO.:  [id number]   MEDICAL RECORD NO.:  [id number]          PATIENT TYPE:  ORB   LOCATION:  4502                         FACILITY:  [facility]   PHYSICIAN:  Ellwood Dense, M.D.   DATE OF BIRTH:  11-Apr-1927   DATE OF ADMISSION:  12/22/2005  DATE OF DISCHARGE:                                HISTORY & PHYSICAL   PRIMARY CARE PHYSICIAN:  Aida Puffer, M.D., [name], [place].   ORTHOPEDIC SURGEON:  Feliberto Gottron. Turner Daniels, M.D.   HISTORY OF PRESENT ILLNESS:  Ms. [name] is a 77 year old adult female with  history of right total knee replacement in 1996 for which she received home  health therapy after discharge.   The patient was admitted December 17, 2005, with increased right knee pain.  X-rays showed patellar subluxation.  She underwent a right total knee  revision December 17, 2005, by Dr. Turner Daniels.  She was placed on Coumadin for DVT  prophylaxis and allowed weightbearing as tolerated.  PCA pump was recently  discontinued.  She was evaluated by inpatient rehabilitation and thought to  be an appropriate candidate for subacute level therapies. She was moved to  the subacute unit for that purpose today.   REVIEW OF SYSTEMS:  Positive for reflux and joint swelling.   PAST MEDICAL HISTORY:  1.  Hypertension.  2.  Hyperlipidemia.  3.  Cervical fusion in 1987.  4.  Prior hysterectomy.  5.  Bunionectomy in 1997.  6.  Vein stripping.  7.  Left eye surgery.  8.  Right total knee replacement in 1996.  9.  Left total knee replacement September 25 through September 09, 2005 on      subacute.   FAMILY HISTORY:  Positive for coronary artery disease, hypertension, and  stroke.   SOCIAL HISTORY:  The patient lives alone in [place].  She has  family locally, but they work. Assistance is needed for household  management.  It is a one-level home with 2 steps to enter.  Alcohol and  tobacco are denied by the patient.   FUNCTIONAL HISTORY:  Independent  with a cane and walker prior to admission.   MEDICATIONS:  1.  Hydrochlorothiazide 50 mg twice daily.  2.  Lasix p.r.n.  3.  Nexium 40 mg twice daily.  4.  Vitorin 10/20 three times per week.   ALLERGIES:  No known drug allergies.   RECENT LABORATORY DATA:  Sodium 140, potassium 4.6, chloride 99, CO2 30, BUN  10, creatinine 0.7.  Recent INR 2.3. Hemoglobin 10.3, hematocrit 30.8,  platelet count 252,000, white count 6.5.   PHYSICAL EXAMINATION:  GENERAL: Reasonably well-appearing, elderly female  seated up in a chair in no acute discomfort.  VITAL SIGNS: Blood pressure 171/99 with pulse of 84, respiratory rate 20,  and temperature 97.8.  HEENT:  Normocephalic and atraumatic.  CARDIOVASCULAR: Regular rate and rhythm.  S1 and S2 without murmurs.  ABDOMEN:  Soft, nontender with positive bowel sounds.  LUNGS: Clear to auscultation bilaterally.  NEUROLOGIC: Alert and oriented x3.  Cranial nerves II-XII intact.  Bilateral  upper extremity strength 5-/5 throughout.  Bulk and tone were normal.  Reflexes 2+ and symmetric.  Sensation intact to light touch bilateral upper  extremities.  EXTREMITIES:  Lower extremity exam showed a dry dressing on the right knee  joint.  She had 4-/5 strength throughout the right lower extremity and 4+/5  strength throughout the left lower extremity.  There is a well-healed left  knee wound present.   IMPRESSION:  Status post right total knee revision secondary to loosening,  subluxation.  Presently the patient has impairments in activities of daily  living, transfers, and ambulation.  We will admit to the subacute unit for  daily therapies.   PLAN:  1.  Admit to subacute unit for daily OT and PT therapies to advance ADLs,      transfers, ambulation, and household management.  2.  Twenty-four-hour nursing for medication administration and monitoring of      vital signs along with wound dressing changes.  3.  Check admission labs to include CBC and CMET  Tuesday, December 23, 2005.  4.  Continue Coumadin per pharmacy for deep vein thrombosis prophylaxis.  5.  Continue Vitorin 1 tablet every Monday, Wednesday, and Friday for      dyslipidemia.  6.  Monitor hypertension on hydrochlorothiazide 50 mg twice daily.  7.  Continue Protonix 80 mg twice daily for GI prophylaxis.  8.  Continue oxycodone 5 mg 1 to 2 tablets p.o. q.4-6h. p.r.n. for pain.  9.  Continue CPM 0 to 60 degrees, advancing by 5 to 10 degrees daily to      maximum of 90 degrees.  10. P.R.N. Robaxin q.6h. for pain.   PROGNOSIS:  Good.   GOALS:  Modified independent ADLs, transfers, ambulation.   ESTIMATED LENGTH OF STAY:  5 to 8 days.           ______________________________  Ellwood Dense, M.D.     DC/MEDQ  D:  12/22/2005  T:  12/22/2005  Job:  [id number]   cc:   Aida Puffer, M.D.  [place], [place] J. Turner Daniels, M.D.  Fax: [phone]

## 2011-04-25 NOTE — Discharge Summary (Signed)
NAMENANCIE, BOCANEGRA               ACCOUNT NO.:  [id number]   MEDICAL RECORD NO.:  [id number]          PATIENT TYPE:  INP   LOCATION:  5002                         FACILITY:  [facility]   PHYSICIAN:  Feliberto Gottron. Turner Daniels, M.D.   DATE OF BIRTH:  Jun 28, 1927   DATE OF ADMISSION:  12/17/2005  DATE OF DISCHARGE:  12/22/2005                                 DISCHARGE SUMMARY   PRIMARY DIAGNOSIS FOR THIS ADMISSION:  Painful right total knee  arthroplasty.   PROCEDURE IN THE HOSPITAL:  Revision total knee arthroplasty.   HISTORY OF PRESENT ILLNESS:  The patient is a 77 year old woman, who  underwent a primary right total knee by Dr. Beverly Gust in the mid  1990s.  On the femoral side, it was DePuy coordinate femur and on the tibial  side it was an AMK tibial component with a 16 mm polyethelene spacer and a  lateral wedge.  Because she had previously had a high tibial osteotomy.  In  any event over the last year has become more and more painful, a bloody  effusion that was actually sent off for a Gram stain culture by Dr. Turner Daniels  prior to this admission came back 28,000 cells and no growth at 3 and 5  days, and the patient was not on an antibiotic.  She had a primary total  knee done by 1 of Dr. Wadie Lessen partners on the left side back in October and  he referred to Dr. Turner Daniels because of the increasing pain in her right total  knee.  Plain radiographs show that the patellar component appeared to be  subluxed laterally with the patella almost standing on its side in the  sunrise view.  The radiographs taken earlier in June show the same  configuration, but is much less painful.  In any event, after the fluid came  back negative for infection and with no signs of infection such as fevers,  she was counseled that she may benefit from revision of the patella.  The  bloody effusion may have been from the bony erosion of the patella on the  femoral component or also poly-wear.  She counseled that if it  looked like  it was infected when the surgery began that we would remove the parts and  put in a polyethelene spacer with antibiotics but the primary plan would be  to replace the patellar recess button, revise the patellar component and  release whatever tissues need to be released to get the patella to line up  properly and evaluate the thermal __________ for any loosening and revise  the tibia with polyethelene since it had been  in for almost 10 years.  The  risks and benefits were discussed, all questions answered and the patient  was prepared for surgery.  The patient shows no allergies to medication.   PREOPERATIVE MEDICATIONS:  Hydrochlorothiazide, Lasix, Nexium, Vytorin,  Atacand and Vicodin.   PAST MEDICAL HISTORY:  1.  Hypertension.  2.  Peptic ulcer disease.  3.  Venous stasis.  4.  Hyperlipidemia.   SURGICAL HISTORY:  Right total knee in 1996  and left total knee in 2006,  hysterectomy, vein ligation and __________.   SOCIAL HISTORY:  No tobacco, no ethanol and  no IV drug use.  She is widowed  and lives alone.   FAMILY HISTORY:  Positive for CVA, CAD and hypertension.   REVIEW OF SYSTEMS:  Positive for glasses and upper dentures.  The patient  denies any recent illness, shortness of breath or chest pain.   EXAMINATION:  VITAL SIGNS:  Temperature 97.5, pulse 100, respirations 20,  blood pressure 142/80, she is 5 feet 3 inches and 175-pound female.  HEENT:  Positive for decreased range of motion of the C-spine secondary to  surgery.  CHEST:  Clear to auscultation and percussion.  HEART:  Regular rate and rhythm.  ABDOMEN:  Soft and nontender.  EXTREMITIES:  Right knee has positive effusion.  Patella obviously  dislocated with positive pain.   PREOPERATIVE LABORATORY:  CBC, CMET, a chest x-ray, EKG and PTT were all  within normal limits with the exception of her chest x-ray, in which did  show a mild cardiac prominence and bronchitic change.   HOSPITAL COURSE:   On the day of admission, the patient was taken to the  operating room at [facility] where she underwent a revision of the right  total knee patellar component and tibial polyethelene.  A medium Hemovac  double arm was placed.  A Foley catheter was placed preoperatively.  Perioperative antibiotics were begun.  Postoperative Coumadin prophylaxis  with a target INR of 1.5 to 2.  Physical therapy was begun postoperative day  1.  Postoperative day 1, the patient was awake and alert, pain controlled  with PCA and no nausea or vomiting, vital signs were stable and __________  draining less than 50 mL __________, no fever, urine output product 50, she  was neurovascularly intact and she began CPM that day 0-30 degrees.  Rehab  consult was obtained.  The patient was felt she would benefit from an  inpatient rehab stay.  Postoperative day 2, the patient was without  complaint, T-max 100.1, hemoglobin 9.6 and INR 1.4, calf was soft, dressing  was dry and she continued physical therapy, PCA was discontinued, as was the  Foley.  Postoperative day 3, the patient was afebrile, vital signs were  stable with minimal complaint of pain, no other complaints, hemoglobin 10.3,  WBC 6.5, dressing was changed, the incision was healing well with no signs  of infection and she continued to await SACU or rehab placement.  Postoperative day 4, basically unchanged, taking food well and progressing  well in PT.  Postoperative day 5, the patient was without complaint, she was  afebrile, vital signs were stable, dressings dry, thigh and calf were soft  and nontender, she was okayed for rehab or SACU bed and a SACU bed did  become available, so she was transferred to their care.  At the time of her  discharge to the [facility], she was medically stable and improved after her  revision total knee arthroplasty.  We will continue to follow her during her hospital stay rehab and she will return to see [place] within 1 week's time of   her discharge from their facility.      Laural Benes. Jannet Mantis.      Feliberto Gottron. Turner Daniels, M.D.  Electronically Signed    JBR/MEDQ  D:  01/26/2006  T:  01/27/2006  Job:  [id number]

## 2011-04-25 NOTE — Discharge Summary (Signed)
[name], [name]               ACCOUNT NO.:  [id number]   MEDICAL RECORD NO.:  [id number]          PATIENT TYPE:  ORB   LOCATION:  4530                         FACILITY:  [facility]   PHYSICIAN:  Ellwood Dense, M.D.   DATE OF BIRTH:  [birth year]   DATE OF ADMISSION:  09/01/2005  DATE OF DISCHARGE:  09/09/2005                                 DISCHARGE SUMMARY   DISCHARGE DIAGNOSES:  1.  Left total knee replacement secondary to degenerative joint disease,      August 26, 2005.  2.  Pain control.  3.  Coumadin for deep vein thrombosis prophylaxis.  4.  Postoperative anemia.  5.  Hypertension.  6.  Hypokalemia.  7.  Hyperlipidemia.   HISTORY OF PRESENT ILLNESS:  This is a 77 year old female with history of a  right total knee replacement in 1996, who is now admitted August 26, 2005  with end-stages of the left knee.  No relief with conservative care.  Underwent a left total knee replacement August 26, 2005 by Dr. Jerl Santos.  Placed on Coumadin for deep vein thrombosis prophylaxis, weightbearing as  tolerated.  Postoperative hypokalemia at 2.7, with runs of potassium  chloride added, and changed potassium to 3.2.  The patient was admitted for  a comprehensive program.   PAST MEDICAL HISTORY:  See discharge diagnoses.   No alcohol or tobacco.   ALLERGIES:  None.   MEDICATIONS PRIOR TO ADMISSION:  1.  Hydrochlorothiazide 50 mg twice daily.  2.  Nexium 40 mg twice daily.  3.  Allegra daily.  4.  Lasix 40 mg as needed.  5.  Vytorin daily.  6.  Aspirin 325 daily.  7.  Os-Cal 500 mg at bedtime.  8.  Multivitamin daily.   SOCIAL HISTORY:  Lives alone.  Retired.  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  One-level  home, 3-4 steps to entry.   HOSPITAL COURSE:  Patient with progressive gains while on rehab services,  with therapies initiated daily.  The following issues were followed during  the patient's rehab course.  Pertaining to [name] left total knee  replacement, August 26, 2005, surgical site healing nicely.  No signs of  infection.  Weightbearing as tolerated.  Ambulating minimal assist with a  rolling walker.  She was minimal assist for bed mobility and transfers,  simple setup for activities of daily living, except minimal assist lower  body dressing.  Home health therapies have been arranged.  She remained on  Coumadin for deep vein thrombosis prophylaxis, with latest INR of 2.2.  She  will complete Coumadin protocol and resume aspirin therapy.  Postoperative  anemia again was stable, with latest hemoglobin 9.4, hematocrit 26.5.  She  was on iron supplement.  Blood pressures controlled with hydrochlorothiazide  50 mg twice daily.  Noted hypokalemia, with potassium levels of 2.7,  improved to 3.6, with supplement of potassium chloride 20 mEq twice daily.  She remained on her Vytorin for hyperlipidemia.  She had no bowel or bladder  disturbances.  She was discharged to home without complaint.   DISCHARGE MEDICATIONS AT TIME OF DICTATION:  1.  Coumadin, with latest dose of 3 mg, to be completed on September 25, 2005.  2.  Allegra daily.  3.  Potassium chloride 20 mEq twice daily.  4.  Os-Cal 500 mg at bedtime.  5.  Trinsicon twice daily.  6.  Hydrochlorothiazide 50 mg twice daily.  7.  Nexium 40 mg twice daily.  8.  Multivitamin daily.  9.  Vytorin daily.  10. Oxycodone as needed for pain.   The patient was advised to follow up with Dr. Clarene Duke, [phone], for medical  management; Dr. Jerl Santos, orthopedic services.  Home health therapies have  been arranged.  Home health nursing arranged to complete Coumadin protocol.  Patient was advised to resume aspirin therapy after Coumadin completed.      [name], P.A.    ______________________________  Ellwood Dense, M.D.    DA/MEDQ  D:  09/08/2005  T:  09/08/2005  Job:  [id number]   cc:   Lubertha Basque. Jerl Santos, M.D.  Fax: [phone]   Winn Jock. Lovenia Kim., M.D.  [facility]  [address]  [place] [address]  Phone [phone]

## 2011-04-25 NOTE — Discharge Summary (Signed)
[name], [name]               ACCOUNT NO.:  [id number]   MEDICAL RECORD NO.:  [id number]          PATIENT TYPE:  ORB   LOCATION:  4502                         FACILITY:  [facility]   PHYSICIAN:  Mariam Dollar, P.A.  DATE OF BIRTH:  1927/09/08   DATE OF ADMISSION:  12/22/2005  DATE OF DISCHARGE:  12/27/2005                                 DISCHARGE SUMMARY   DISCHARGE DIAGNOSES:  1.  Right knee revision, December 17, 2005.  2.  Pain management.  3.  Coumadin for deep vein thrombosis prophylaxis.  4.  Postoperative anemia.  5.  Hypertension.  6.  Hypokalemia.  7.  Left total knee arthroplasty, September 2006.   HISTORY OF PRESENT ILLNESS:  This is a 77 year old female admitted January  10th with increased right knee pain, x-rays with patella subluxation,  patient noted to have a right total knee arthroplasty of 1996.  Underwent a  right knee revision, January 10th, per Dr. Turner Daniels.  Placed on Coumadin for  deep vein thrombosis prophylaxis, weightbearing as tolerated.  Postoperative  pain control with PCA discontinued January 12th, no chest pain, no nausea or  vomiting, patient was admitted to [facility].   PAST MEDICAL HISTORY:  See Discharge Diagnoses.   HABITS:  No alcohol or tobacco.   ALLERGIES:  None.   SOCIAL HISTORY:  Lives alone in [place], local family works,  neighbors check as needed, one-level home, two steps to entry.   MEDICATIONS PRIOR TO ADMISSION:  1.  Hydrochlorothiazide 50 mg twice daily.  2.  Lasix as needed.  3.  Nexium 40 mg twice daily.  4.  Vytorin 10-20, 3 times a week.   REHABILITATION HOSPITAL COURSE:  The patient was admitted to [facility] with therapies initiated daily consisting of physical therapy,  occupational therapy, and rehabilitation nursing.  The following issues were  addressed during her rehabilitation stay.  Pertaining to Mrs. Celaya  right knee revision, surgical site healing nicely, no signs of  infection,  she was ambulating extended distances with a walker, weightbearing as  tolerated, CPM machine as advised.  Pain management ongoing with the use of  Oxycodone and Robaxin with good results.  She remained on Coumadin for deep  vein thrombosis prophylaxis with latest INR of 2.4, she will complete  Coumadin protocol as advised.  Postoperative anemia with latest hemoglobin  of 11.5, hematocrit 33.6.  She had some mild hypokalemia during her rehab  stay of 3.1, her hydrochlorothiazide was decreased to 25 mg twice daily, she  did have some modest changes in her blood pressure and Tenormin was added 25  mg a day on January 17th.  With her advanced therapy, she still had some  complaints of mild knee discomfort, Oxy-Contin Sustained Release was added  at 10 mg twice daily on January 16th that would be tapered at time of  discharge.  She will remain on her Vytorin for hyperlipidemia.  Functionally, she was minimal assist for bed mobility, transfers, ambulating  150 feet with a rolling walker, minimal assist for lower body, minimal set-  up for upper body.  DISCHARGE MEDICATIONS AT TIME OF DICTATION:  1.  Coumadin, latest dose of 3 mg to be completed on January 17, 2006, 10-      20, 1 tablet Monday, Wednesday, Friday.  2.  Claritin 10 mg daily.  3.  Hydrochlorothiazide 25 mg twice daily.  4.  Potassium chloride 20 mEq daily.  5.  OxyContin Sustained Release 10 mg every 12 hours x1 week and stop.  6.  Tenormin 25 mg p.o. daily.  7.  Robaxin 500 mg every 6 hours as needed spasms.  8.  Oxycodone Immediate Release 5 mg, 1 or 2 tablets every 4 hours as needed      for breakthrough pain.   ACTIVITY:  Weightbearing as tolerated with walker.   DIET:  Regular.   WOUND CARE:  Cleanse incision daily with warm soap and water.   SPECIAL INSTRUCTIONS:  Home Health nurse to complete Coumadin protocol, PT  [phone].      Mariam Dollar, P.A.     DA/MEDQ  D:  12/25/2005  T:   12/25/2005  Job:  [id number]   cc:   Feliberto Gottron. Turner Daniels, M.D.  Fax: [phone]   Aida Puffer, M.D.  [place]

## 2011-04-25 NOTE — Discharge Summary (Signed)
[name], [name]               ACCOUNT NO.:  [id number]   MEDICAL RECORD NO.:  [id number]          PATIENT TYPE:  INP   LOCATION:  5015                         FACILITY:  [facility]   PHYSICIAN:  Elizabeth Basque. Dalldorf, M.D.DATE OF BIRTH:  Mar 04, 1927   DATE OF ADMISSION:  08/26/2005  DATE OF DISCHARGE:  09/01/2005                                 DISCHARGE SUMMARY   ADMISSION DIAGNOSIS:  1.  End stage degenerative joint disease, left knee.  2.  Hypertension.  3.  Impingement of the left shoulder.  4.  Hyperlipidemia.   DISCHARGE DIAGNOSIS:  1.  End stage degenerative joint disease, left knee.  2.  Hypertension.  3.  Impingement of the left shoulder.  4.  Hyperlipidemia.   OPERATIONS:  Left total knee replacement.   BRIEF HISTORY:  [name] is a 77 year old white female with a long-  standing history of left knee pain.  She is well known to our practice.  She  has had and has failed non-steroidal anti-inflammatory drugs and  corticosteroid injections.  She is now having pain with every step and  trouble sleeping at nighttime.  X-rays reveal end stage DJD.  I have  discussed treatment options with her, that being total knee replacement with  the risks of anesthesia, DVT, infection, and possible death.   PERTINENT LABORATORY AND X-RAY FINDINGS:  EKG normal sinus rhythm, WBC 8,  RBC 3.12, hemoglobin 9.7, hematocrit 27.7.  INR 2.2.  Sodium 131, potassium  3.2, glucose 126, BUN 13, creatinine 0.6.   HOSPITAL COURSE:  She was admitted postoperatively, initially was on  lactated ringers IV at 80 mL an hour, Ancef 1 gram q.8h. x 3 doses, Colace,  Senokot, Trinsicon, and also given laxative of choice p.r.n.  Coumadin and  Lovenox protocol per pharmacy for DVT prophylaxis.  Percocet and Phenergan  also done and her medicines were reconciled with her home medicine sheet  which were to include Nexium, Lasix, HCTZ, and Vytorin.  She also had a  Foley catheter for the first two days,  incentive spirometry q.1h. while  awake, ice to her knee, knee hi TEDs, knee immobilizer as needed with  walking or in bed, CPM machine 0 to 50, advance 10 degrees daily, PT and OT  and rehab consult as well as case management consult, and then follow up lab  studies as mentioned.  The first day postop, her blood pressure was 167/66,  heart rate 101, temperature 98, hemoglobin 12, potassium 2.7, INR 1.  Her  lungs were clear, her abdomen was soft.  Her knee wound was benign with no  signs of infection.  The Foley catheter was working, Hemovac was in, CPM was  on.  We did increase her potassium to 20 mEq b.i.d., discontinued her Foley  catheter and PCA pump on September 21.  Physical therapy worked with her to  be weight-bearing as tolerated.  The second day postop, blood pressure  174/75, heart 102, temperature spike to 102, then back to 97.9, hemoglobin  11.7, potassium 2.8, INR 1.6.  Her IV is discontinued and PCA and Foley  catheter  were discontinued.  We continued on her potassium supplementation  and she progressed well with physical therapy.  She was discharged to [facility]  as of September 25.   CONDITION ON DISCHARGE:  Improved.   DISCHARGE INSTRUCTIONS:  She will remain on her Coumadin per pharmacy,  Percocet for pain, and Lasix, Vytorin, hydrochlorothiazide, and Nexium are  home medications.  Continue physical therapy, weight-bearing as tolerated  with range of motion.  Diet advance as tolerated, unrestricted.  Dressing  changes could be done daily.  Protime is to be drawn and dictated by  pharmacy.  Any sign of infection, call [phone].      [name], P.A.      [name], M.D.  Electronically Signed    MC/MEDQ  D:  10/09/2005  T:  10/09/2005  Job:  [id number]

## 2011-04-25 NOTE — Discharge Summary (Signed)
[name], MILLWARD               ACCOUNT NO.:  [id number]   MEDICAL RECORD NO.:  [id number]          PATIENT TYPE:  INP   LOCATION:  5019                         FACILITY:  [facility]   PHYSICIAN:  Feliberto Gottron. Turner Daniels, M.D.   DATE OF BIRTH:  [birth year]   DATE OF ADMISSION:  07/01/2006  DATE OF DISCHARGE:  07/08/2006                                 DISCHARGE SUMMARY   PRIMARY DIAGNOSIS FOR THIS ADMISSION:  Infected right revision total knee.   DISCHARGE SUMMARY:  Patient is 77 year old woman that first came to Dr.  Wadie Lessen attention in December of '06.  She had had a right AMK total knee  cemented in place 12 years ago another practice.  However, 3 or 4 months  prior to the patient coming to Dr. Wadie Lessen practice, she had increased  swelling and pain in her right total knee.  She was being followed by Dr.  Marcene Corning, one of Dr. Wadie Lessen partners who had done the left total knee  on the patient in September.  Because of the swelling and pain and the  radiograph showing that the patella was tilted almost 90 degrees, patient  was seen in consultation.  Dr. Turner Daniels aspirated the knee at that point and  got 30 mL of brownish fluid that came back with a cell count of 2800 and no  growth.  This was done twice and both times it was no growth.  The patient  is rheumatoid arthritic and it was felt that she had an effusion secondary  to mechanical symptoms from the erosion of the patella.  Patient was taken  to the operating room in January 2007.  Results from that surgery were also  negative.  She underwent patella revision and actually did quite well until  June 2007 when she had some swelling and pain in the knee.  Attempts to  aspirate it only got a couple of drops of blood out.  The patient was placed  on Keflex for a few weeks and got better.  Came off the Keflex and then had  recurrent peripheral swelling inferior and medial to the patella.  When the  patient was seen in the office on the day of  admission, it seemed to be  fluctuant.  About 1 or 2 mL of brownish-yellow purulent material was  aspirated.  This was sent off for Gram stain culture that at the time of  patient's operation had come back negative for any obvious organism.  Because of the findings of that aspirate, she was prepared for surgical  irrigation debridement of right total knee for presumed infection.  The  thought was that it was hoped it would be superficial but if it went onto  the prosthesis she would undergo a radical synovectomy irrigation  debridement.  Risks and benefits of surgery were discussed and she  understands and wishes to proceed.  Understands that she may eventually have  to undergo removal of prosthesis.  Patient has no known drug allergies.  Her  medications at time of admission  were hydrochlorothiazide, Lasix, Nexium,  Vytorin, and Vicodin.  PAST MEDICAL HISTORY:  1. Hypertension.  2. Increased lipids.  3. DJD.  4. Rheumatoid arthritis.   SURGICAL HISTORY:  1. Cervical fusion.  2. Hysterectomy.  3. Right total knee revision December 17, 2005 and then a left total knee      was performed in September 2006.   SOCIAL HISTORY:  No alcohol or tobacco.   Patient last ate between 8:30 and 9 o'clock that morning.   FAMILY HISTORY:  CAD, hypertension, and CVA.   REVIEW OF SYSTEMS:  Positive for reflux, upper dentures, chest pain.  Denies  any recent illness or shortness of breath.   EXAM:  Patient's temperature is 98.3.  Pulse is 96.  Blood pressure 182/100.  She is a 5'3 175-pound female.  Head is normocephalic.  Ears:  TMs are clear.  Eyes:  Pupils equal, round,  reactive to light and accommodation.  Nares patent, throat benign.  NECK:  Decreased range of motion secondary to fusion.  No pain.  CHEST:  Clear to auscultation.  HEART:  Regular rapid rate.  No murmurs.  ABDOMEN:  Soft, nontender.  EXTREMITIES:  Positive erythema of the right distal portion of the wound of  the  revision right total knee arthroplasty.   IMPRESSION:  Cellulitis, right total knee arthroplasty with possible deeper  infection.   HOSPITAL COURSE:  On the day of admission, patient was taken to operating  room at [facility] where she underwent a right total knee radical  irrigation and debridement.  Foley catheter was placed preoperatively.  Two  medium Hemovacs were placed deep into the wound and one superficial Hemovac  was placed for drainage.  She was placed on preoperative antibiotics and was  placed on the postoperative PCA for pain control.  Infectious disease was  consulted on the day of admission to aid in antibiotic choices.  Postoperative day 1, the patient was complaining of minimal pain, which was  well controlled with PCA.  No nausea or vomiting.  Hemoglobin 11.0, WBC of  7.2.  She is afebrile.  Vital signs are stable.  Drain output 50 mL.  Urine  output 500 mL.  Dressing was dry.  Drainage continued.  PCA was continued  and culture results were being monitored.  Recommendation from infectious  disease was to add rifampin and ceftriaxone, and this was done.  Postoperative day 2, the patient was awake and alert, moderate pain.  No  change from previous pain preoperatively.  T-max 100.8.  Wound showed  markedly decreases swelling.  Skin was clear.  Superficial drain had less  than 10 mL.  Deeper drains approximately 80 mL.  Currently on combination of  vanco and ceftriaxone and rifampin.  Cultures were negative at this point.  Sed rate was 43.  Rheumatoid factor 320.  Knee immobilizer was continued as  were drains.  Patient was again seen by infectious disease, which felt that  she would benefit from continued IV antibiotics even after her discharge.  Postoperative day 3, the patient is reporting feeling much better.  T-max  were 101.8 ranging to 98.5.  Hemoglobin 10.8, WBC 6.7.  Drain output 75 mL in the deeper and 25 in the upper.  Cultures, no growth.  The  superficial  drain was discontinued.  Wound edges remained benign.  PIC line was placed  and she continued with incentive spirometry.  Postoperative day 4, patient  was complaining of feeling malaise.  T-max 101 to ranging 98.3.  Cultures  remained negative for growth.  Dressing was dry.  Wound was benign.  She  continued IV antibiotics.  Postoperative day 5, the patient was complaining  of rash and itching but on exam, there was no obvious rash on the trunk  area, just some folds of skin consistent with a superficial yeast-like  infection.  T-max 100.2 ranging to 97.9.  Vital signs were stable.  Cultures  remain no growth.  Hemovac output less than 10 mL per shift.  She continued  on IV and oral antibiotics.  Skin was observed for signs of any type of drug  eruption and topical creams were applied.  Postoperative day 6, patient was  awake and alert, using walker with complaints of rash in the groin and  axilla, responding slowly.  Vital signs were stable.  Drains were less than  50 mL and pulled.  She continued on antibiotics and it was hoped that she  would be discharged the following day.  The following day, the patient was  complaining of moderate pain and weakness.  She was afebrile.  Vital signs  were stable.  Patient was having some difficulty with her home situation,  did not have anyone there to take care of her, and so her discharge was held  for an additional day.  The following day, she was discharged home to the  care of her family.  She is awake and alert.  Wound was clean and dry.  She  was overall improved both medically and orthopedically.  She will continue  with IV antibiotics post-discharge, vancomycin and  will be taking p.o. Cipro and rifampin.  She will return to clinic in  approximately 6 to 8 days for followup check.  Weightbearing as tolerated  with walker and knee immobilizer.  No range of motion as of yet.  Diet is  regular.  Resume home meds as per home med rec  sheet.      Laural Benes. Jannet Mantis.      Feliberto Gottron. Turner Daniels, M.D.  Electronically Signed    JBR/MEDQ  D:  09/07/2006  T:  09/07/2006  Job:  [id number]

## 2011-04-25 NOTE — Op Note (Signed)
NAMEHEAVENLEIGH, PETRUZZI               ACCOUNT NO.:  [id number]   MEDICAL RECORD NO.:  [id number]          PATIENT TYPE:  OIB   LOCATION:  5019                         FACILITY:  [facility]   PHYSICIAN:  Feliberto Gottron. Turner Daniels, M.D.   DATE OF BIRTH:  25-Feb-1927   DATE OF PROCEDURE:  06/30/2006  DATE OF DISCHARGE:                                 OPERATIVE REPORT   PREOPERATIVE DIAGNOSIS:  Infected revision right total knee.   POSTOPERATIVE DIAGNOSIS:  Infected revision right total knee.   PROCEDURE:  Right total knee radical irrigation and debridement.   SURGEON:  Feliberto Gottron. Turner Daniels, M.D.   FIRST ASSISTANT:  Skip Mayer, P.A.-C.   ANESTHETIC:  General endotracheal.   ESTIMATED BLOOD LOSS:  Minimal.   FLUID REPLACEMENT:  1500 mL crystalloid.   DRAINS PLACED:  Foley catheter and 2 medium hemostats deep and 1 superficial  Hemovac.   INDICATIONS FOR PROCEDURE:  A 77 year old woman who I met back in December  2006.  She had a right AMK total knee cemented placed about 12 years ago,  and over the 3 or 4 months prior to meeting her, she had increasing pain and  swelling in her right total knee.  She is being followed by Hurshel Keys,  one of my partners, who had done a left total knee on her back in September,  and because of swelling and pain and radiographs showing that the patella  was tilted almost 90 degrees, he had her see me in consultation. I aspirated  out 30 mL of brownish fluid that came back with a cell count of 28,000 and  no growth.  This was done twice, and both times it was no growth.  She is a  rheumatoid, and it was felt that she had an effusion secondary to mechanical  symptoms from erosion of the patella.  She was taken to the operating room  in January 2007.  Culture from that surgery were also negative.  She  underwent patellar revision and actually did quite well until June of 2007  when she had some swelling and pain in the knee.  Attempted aspirate only  got a couple of  drops of blood out.  She was placed on Keflex for a few  weeks and got better, came off the Keflex and then had recurrent focal  swelling inferior and medial to the patella.  When I saw her in the office  today, I thought it was fluctuant and aspirated out about 1-2 mL of brownish  yellow purulent material.  This was sent off for Gram stain and culture that  this time has come back with white cells, no organisms, and at 24 hours, the  cultures remained negative on the preliminaries.  In any event, based on the  findings of the aspirate, she was prepared for a surgical irrigation and  debridement of her right total knee for presumed infection.  The thought  would be that was hopefully just superficial, but if it went down to the  prosthesis, we would simply open up her knee and do a radical synovectomy,  irrigation  and debridement.  Risks and benefits of surgery discussed.  She  was prepared for intervention.  Questions answered.  She understands she may  eventually have to have removal of the prosthesis.   DESCRIPTION OF PROCEDURE:  The patient identified by armband, taken the  operating room at [facility].  Appropriate anesthetic monitors  were attached and general endotracheal anesthesia induced with the patient  in supine position.  Tourniquet applied high to the right thigh and right  lower extremity prepped and draped in usual sterile fashion from the ankle  to the mid thigh.  The limb was held elevated for about 90 seconds and then  the tourniquet inflated.  We did not use an Esmarch.  The inferior 1/2 of  the wound was then recreated with a 10 blade down to the subcutaneous  tissue.  We dissected medially and identified the subcutaneous abscess which  did have purulent materials and offered Gram stain and culture.  The gram  stain came back with monitor white cells, no organisms.  We did find a sinus  tract that tracked down to the tibial plate, and based on this, I  performed  a standard medial parapatellar arthrotomy and noted that there was  significant erosion of the quad mechanism as it inserted on the patella.  The patient also had very turned down or inflamed synovium, and a radical  synovectomy was performed removing as much of the inflamed synovium as  possible.  There was also fibrinous material underneath the distal aspect of  the femoral component where the went into the femoral component and some  fibrinous material medial and lateral to the bearing.  Because this was an  old AMK system, the bearing was not swapped out since it would take a number  of days to get a new bearing in since there were no longer readily  available.  After the radical synovectomy was performed, the knee was  irrigated out with pulse lavage normal saline solution, a liter and half,  followed by a bottle of hydrogen peroxide followed by more pulsatile  irrigation.  Satisfied with the irrigation and debridement, large bore  hemostats were placed deep.  The parapatellar arthrotomy was closed with  running #1 Vicryl suture and then a superficial drain placed in the  subcutaneous tissue, especially over the abscess area, and the subcu and  skin were closed with a single layer of vertical mattress #1 nylon sutures.  A dressing of 4x4 dressing sponges, Webril and Ace wrap was then placed.  The patient was placed in a knee immobilizer, the tourniquet let down.  She  was awakened and taken to the recovery room without difficulty.      Feliberto Gottron. Turner Daniels, M.D.  Electronically Signed     FJR/MEDQ  D:  07/01/2006  T:  07/01/2006  Job:  [id number]

## 2011-04-25 NOTE — Op Note (Signed)
[name], [name]               ACCOUNT NO.:  [id number]   MEDICAL RECORD NO.:  [id number]          PATIENT TYPE:  INP   LOCATION:  NA                           FACILITY:  [facility]   PHYSICIAN:  Lubertha Basque. Dalldorf, M.D.DATE OF BIRTH:  January 05, 1927   DATE OF PROCEDURE:  08/26/2005  DATE OF DISCHARGE:                                 OPERATIVE REPORT   PREOPERATIVE DIAGNOSIS:  Left knee degenerative arthritis.   POSTOPERATIVE DIAGNOSIS:  Left knee degenerative arthritis.   PROCEDURE:  Left total knee replacement.   ANESTHESIA:  General and block.   ATTENDING SURGEON:  Lubertha Basque. Jerl Santos, M.D.   ASSISTANT:  Lindwood Qua, P.A.   INDICATIONS FOR PROCEDURE:  The patient is a 77 year old woman with a long  history of degenerative arthritis of both of her knee.  She is status post a  successful opposite-side knee replacement many years ago, but has been left  with a very painful left knee with a significant valgus deformity.  This has  failed all injectables and oral anti-inflammatories.  She has pain which is  disabling to her in that she cannot remain active and has difficulty  sleeping.  She was offered a knee replacement operation.  Informed operative  consent was obtained at discussion of possible complications of reaction to  anesthesia, infection, DVT, PE, and death.   DESCRIPTION OF PROCEDURE:  The patient was taken to the operating suite  where a general anesthetic was applied difficulty.  She was positioned  supine and prepped and draped in normal sterile fashion.  After the  administration of preop IV antibiotic, the left leg was elevated,  exsanguinated, and tourniquet inflated about the thigh.  A longitudinal  anterior incision was made with dissection down to the extensor mechanism.  All appropriate anti-infective measures were used including the preoperative  IV antibiotic, Betadine-impregnated drape, and closed-hooded exhaust systems  for each member of the surgical  team.  The extensor mechanism was exposed  and the medial parapatellar incision was made.  The kneecap was flipped and  the knee flexed.  She had severe degenerative arthritis, lateral and  patellofemoral, and fairly poor bone quality.  Some residual meniscal  tissues were removed.  I could not find a real ACL and the PCL was removed  as well.  An extramedullary guide was placed on the tibia in order to create  a cut on this bone with a slight posterior tilt.  An intramedullary guide  was then placed in the femur in order to make anterior and posterior cuts,  creating a flexion gap of about 12.5 mm.  A second intramedullary guide was  placed the femur and was utilized to correct her valgus deformity and create  an extension gap equal at 12.5 mm.  This did require a slight soft tissue  release off the femur from the lateral aspect and a sequential lengthening  of the IT band to balance the knee well.  The femur sized to a standard plus  component while the tibia sized to a 4 and the appropriate guides were  placed and utilized.  The patella was cut down in thickness by about 10 mm  and sized to a 38 with the appropriate guide placed and utilized.  A trial  reduction was done with all these components and the knee easily came to  full extension.  Her valgus deformity seemed to have been eliminated.  The  knee flexed well, but the kneecap did track in a slightly lateral position  consistent with the fact that we had addressed the significant valgus  deformity.  I did perform an open lateral release which seemed to correct  things significantly.  All trial components were removed followed by  pulsatile-lavage irrigation of all the bony surfaces.  Cement was mixed  including antibiotic.  The cement was then pressurized into all the bones,  followed by placement of the DePuy LCS components, which were a standard  plus femur, 4 tibia, 15-mm deep-dish spacer, and 38-mm all-polyethylene  patella.   Excess cement was trimmed and pressure was held on the components  until the cement had hardened.  The tourniquet was deflated and a small  amount of bleeding was easily controlled with Bovie cautery.  The knee then  ranged fully and the kneecap seemed to track well.  The extensor mechanism  was reapproximated with #1 Vicryl in interrupted fashion.  A drain was  placed exiting superolaterally prior to this closure.  Subcutaneous tissues  were reapproximated in 2 layers with Vicryl followed by skin closure with  staples.  Adaptic was applied to the wound followed by dry gauze and a loose  Ace wrap.  Estimated blood loss and intraoperative fluids can be obtained  from anesthesia records, as can an accurate tourniquet time, which was  approximately 1 hour.   DISPOSITION:  The patient was extubated in the operating room and taken to  the recovery room in stable condition.  Plans were for her to be admitted to  the orthopedic surgery service for appropriate postop care to include  perioperative antibiotics and Coumadin plus Lovenox for DVT prophylaxis.      Lubertha Basque Jerl Santos, M.D.  Electronically Signed     PGD/MEDQ  D:  08/26/2005  T:  08/27/2005  Job:  [id number]

## 2011-04-25 NOTE — Op Note (Signed)
NAMEKRISY, DIX               ACCOUNT NO.:  [id number]   MEDICAL RECORD NO.:  [id number]          PATIENT TYPE:  AMB   LOCATION:  SDS                          FACILITY:  [facility]   PHYSICIAN:  Feliberto Gottron. Turner Daniels, M.D.   DATE OF BIRTH:  04-Aug-1927   DATE OF PROCEDURE:  06/30/2006  DATE OF DISCHARGE:                                 OPERATIVE REPORT   PREOPERATIVE DIAGNOSIS:  Infected right total knee.   POSTOPERATIVE DIAGNOSIS:  Infected right total knee.   PROCEDURE:  Right total knee radical irrigation and debridement.   SURGEON:  Feliberto Gottron. Turner Daniels, M.D.   FIRST ASSISTANT:  Skip Mayer, P.A.-C.   ANESTHETIC:  General endotracheal.   ESTIMATED BLOOD LOSS:  Minimal.   FLUID REPLACEMENT:  A liter of crystalloid.   DRAINS PLACED:  Two deep large-bore Hemovacs and 1 superficial Hemovac and  at the end of the case, a Foley catheter.   TOURNIQUET TIME:  One hour 45 minutes.   INDICATIONS FOR PROCEDURE:  [age]-year-old woman who had AMK knee  placed about 12 years ago by another physician.  She started having  increasing swelling and pain after my partner Dr. Jerl Santos did a left total  knee on her last year and in December, I aspirated her knee for Dr. Jerl Santos  and got out 30 mL of brownish material that came back with a cell count of  28,000 and grew out no organisms x 2.  She is a rheumatoid arthritic.  The x-  rays showed that the patellar component had rotated or tilted to the point  where bone was rubbing on metal and it was thought that that was the source  for pain, since she was not running fevers, she did not have positive  cultures x2 and she was having significant pain.  She was taken to the  operating room in January of 2007, where she underwent revision of the  patellar component.  Again, cultures were negative and she actually did  pretty well from January until about mid June of 2007; at that point, she  started getting a little bit of cellulitis to the  inferior aspect of the  wound with no drainage and no fevers.  She was given about a 10-day course  of Keflex and actually got better.  When she came off the Keflex in early to  mid July, she started having pain in that region again and aspiration was  attempted of the knee that came back essentially negative, except I was able  to get out 2 drops of blood which grew out nothing.  I then saw her back  again today, where she had increased swelling to the area where she had the  erythema.  I went ahead and put a needle in it, aspirated out about a  milliliters and a half of what looked to me to be purulent material and sent  it off for Gram's stain and culture.  The Gram's stain came back with  moderate white cells, no organisms, and because of the increasing pain in  her knee, I elected to take  her to the operating room to at least debride a  superficial abscess and more likely than not, a deep abscess.  Once again,  she denied any fevers.   DESCRIPTION OF PROCEDURE:  The patient was taken to the operating room at  [facility].  Appropriate site monitors were attached and a general  endotracheal anesthesia induced with the patient in the supine position.  Tourniquet was applied high to the right thigh and the right lower extremity  prepped and draped in the usual sterile fashion from the ankle to the  tourniquet and held in an elevated position for about 4-1/2 minutes.  The  tourniquet was then inflated to 350 mmHg and I began the procedure by  reproducing the inferior aspect of the total knee wound through the skin and  subcutaneous tissue and then undermined medially, getting into the abscess  cavity, yielding out about 15 mL of what I thought was purulent material;  this was sent off to the lab and once again came back moderate white cells,  no organisms.  I then carefully traced the sinus tract of the fluid back to  the tibial baseplate of the component and at that point, I  elected to open  the knee.  Prior to that, I had actually placed 20 mL of saline into the  knee joint, none of it escaped, so the only proof I had of communication  between the metal and the abscess pocket was the actual sinus cavity going  down to the metal, although again fluid injected into the knee joint itself  did not come out the sinus tract.  In any event, I went ahead and performed  a medial parapatellar arthrotomy superiorly.  There was definitely some quad  tendon damage with attenuation of the quadriceps mechanism.  The patella was  everted.  There was a large amount of edematous material to the synovium;  this was removed sharply with a 10 blade and also rongeurs and we removed  inflamed material from the distal femur, the proximal tibia and also some  fibrinous material from the notch.  I was able to get a finger around the  back of the tibial component and while there was no inflamed synovium back  there, there was a little bit of fibrinous material that was removed.  Satisfied with a sharp debridement, we went ahead and pulse-lavaged the  wound out with a couple of liters of normal saline solution followed by  hydrogen peroxide, followed by another liter and a half of normal saline  solution.  I then placed large-bore Hemovac drains in the wound.  Because of  the attenuation of the quad mechanism, I elected to go ahead and close the  medial parapatellar arthrotomy with running #1 Vicryl suture and then place  a superficial Hemovac drain in the right leg as well as and close the skin  with vertical mattress #1 nylon sutures.  A dressing of Xeroform, 4 x 4  dressing sponges, Webril and Ace wrap were applied.  The tourniquet was let  down.  The patient was then awakened and taken to the recovery room without  difficulty.  The plan will be to keep the drains until we get culture results back.  If the cultures are negative, I will probably have to  empirically treated her with  antibiotics, unless she reaccumulates fluid, in  which case I will go ahead and remove the total knee components and probably  suggestive a fusion to this  77 year old woman.      Feliberto Gottron. Turner Daniels, M.D.  Electronically Signed     FJR/MEDQ  D:  06/30/2006  T:  07/01/2006  Job:  [id number]

## 2011-04-25 NOTE — Op Note (Signed)
NAMEJENASIS, STRALEY               ACCOUNT NO.:  [id number]   MEDICAL RECORD NO.:  [id number]          PATIENT TYPE:  INP   LOCATION:  5002                         FACILITY:  [facility]   PHYSICIAN:  Feliberto Gottron. Turner Daniels, M.D.   DATE OF BIRTH:  18-Oct-1927   DATE OF PROCEDURE:  12/17/2005  DATE OF DISCHARGE:                                 OPERATIVE REPORT   PREOPERATIVE DIAGNOSIS:  Painful right total knee with subluxed patella and  possible polyethylene wear of the tibia.   POSTOPERATIVE DIAGNOSIS:  Painful right total knee with subluxed patella and  possible polyethylene wear of the tibia.   PROCEDURE:  Revision of right total knee patellar component and tibial  polyethylene.   SURGEON:  Feliberto Gottron. Turner Daniels, M.D.   FIRST ASSISTANT:  Skip Mayer, P.A.-C.   ANESTHETIC:  General endotracheal.   ESTIMATED BLOOD LOSS:  150 mL.   TOURNIQUET TIME:  Fifty-five minutes.   DRAINS PLACED:  A medium Hemovac and Foley catheter.   URINE OUTPUT:  About 300 mL.   FLUID REPLACEMENT:  1200 mL of crystalloid.   INDICATIONS FOR PROCEDURE:  [age]-year-old woman who underwent a  primary right total knee by Dr. Beverly Gust back in the mid 1990s; on  the femoral side, it was a DePuy coordinate femur; on the tibial side, it  was an AMK tibial component with a 16-mm polyethylene spacer and a lateral  wedge, because she had previously had a high tibial osteotomy.  In any  event, over the last year, it has become more and more painful with bloody  effusion that was actually sent off for a Gram's stain and culture by me  last month and came back with 28,000 cells and no growth at 3 and 5 days,  and she was not on antibiotics.  She had had a primary total knee done by  one of my partners on the left side back in October and he referred her on  to me because the increasing pain in her right total knee.  Plain  radiographs showed that the patellar component appeared to be subluxed  laterally with the  patella almost standing on its side on the sunrise view.  Radiographs taken earlier in June of 2006 showed the same configuration, but  it was much less painful back then.  In any event, after the fluid came back  negative for infection -- she had never run a fever -- she was counseled  that she may benefit from revision of the polyethylene of the patella.  The  bloody effusion may have been from the bony erosion of the patella on the  femoral component or also polyethylene wear.  She was counseled that if it  looked like it was infected when we got in there, we would remove the parts  and put in a polyethylene spacer with antibiotics, but the primary plan  would be to remove the patellar recess button, revise the patellar  component, release whatever tissues needed to be released to get the patella  line up properly, evaluate the femoral and tibial components for any  loosening and revise the tibial polyethylene, since it had been in there for  almost 10 years.  Risks and benefits of surgery were discussed, all  questions answered and the patient was prepared for surgical intervention.   DESCRIPTION OF PROCEDURE:  The patient was identified by armband and taken  to the operating room at [facility] where the appropriate anesthetic  monitors were attached and general endotracheal anesthesia was induced with  the patient in the supine position.  A Foley catheter was inserted,  tourniquet applied high to the right thigh, lateral post and a foot  positioner applied to the right side of the table and the right lower  extremity prepped and draped in usual sterile fashion from the ankle to the  tourniquet.  The tourniquet was left down for the approach, since we were  not sure whether or not we were dealing with infection, and a standard  anterior midline incision was made utilizing the old incision which had a  bit of a zigzag as it went over the patella.  We cut down to the level of   the quadriceps tendon and patella and then performed a medial parapatellar  arthrotomy, getting into the knee joint, where we encountered more bloody  fluid, which was sent off for Gram's stain and culture and came back with  monos and polys, but no organisms.  The synovium was somewhat inflamed and  this was stripped pretty much thoroughly from the medial, lateral and  anterior parts of the knee, allowing [place] to better assess the patella.  The  patellar button had the appearance of being set into the medial facet of the  patella and was off-axis by about 40 degrees to the anterior surface of the  patella; in other words, the button and the anterior surface the patella  were not parallel, but were off-angled at about 30-40 degrees, explaining  some of the sunrise view appearance.  The patellar button was removed.  We  saw a good patellar bone stock, about 25 mm to work with, and the patella  was grasped with towel clips and we made a cut that was parallel to the  anterior surface of the patella, leaving about 13 mm of bone to work with.  We then sized for a 35-mm patellar button and drilled the patella in  preparation for the patella revision.  Satisfied with this, we further  stripped synovium from around the polyethylene.  The superficial medial  collateral ligament was elevated off the proximal tibia, going around  towards posteromedial corner and leaving it intact distally, allowing [place] to  evaluate the tibial polyethylene, which did have some wear and may be an  explanation for the inflamed appearance of the synovium.  At this point we  set about removing the tibial component; this required [place] to use osteotomes  to remove the polyethylene from around the tibial post, which exposed the  locking pin, which was then removed with vice grips, allowing [place] to slide  out the bearing.  Synovectomy was performed posteriorly and the knee was then irrigated out thoroughly with normal saline solution.   At this point,  sponges were placed in the wound, the limb was wrapped with an Esmarch  bandage and the tourniquet inflated to 350 mmHg and when we got word from  the lab that there were no organisms in the fluid, once again the knee was  water-picked clean.  We then placed a new 16-mm AMK spacer on the  tibia and  locked it in with a locking pin.  The patella was then dried with suction  and sponges.  A single batch of DePuy fast-setting cement was then mixed  with 750 mg of Zinacef, applied to the mating surfaces of the 35-mm patellar  button and the patella, and the buttons squeezed into place, excess cement  removed and the cement allowed to cure .  More osteophytes were removed from  about the patella, especially laterally, since they stuck out almost a  centimeter, and this in effect did perform a bit of a lateral release and  certainly made the patella tracked better, as did the larger button.  After  the cement had cured, the knee was taken through a range of motion; no  further subluxing of the patella was noted.  A single medium Hemovac drain  was placed deep in the wound, the parapatellar arthrotomy closed with  running #1 Vicryl suture, the subcutaneous tissue with 0 and 2-0 undyed  Vicryl suture and the skin with skin staples.  A dressing of Xeroform, 4 x 4  dressing sponges, Webril and an Ace wrap applied.  The tourniquet was let  down.  The patient was then awakened and taken to the recovery room without  difficulty.      Feliberto Gottron. Turner Daniels, M.D.  Electronically Signed    FJR/MEDQ  D:  12/17/2005  T:  12/18/2005  Job:  [id number]

## 2011-04-25 NOTE — Op Note (Signed)
NAMEXIMENNA, FONSECA               ACCOUNT NO.:  [id number]   MEDICAL RECORD NO.:  [id number]          PATIENT TYPE:  OIB   LOCATION:  5019                         FACILITY:  [facility]   PHYSICIAN:  Feliberto Gottron. Turner Daniels, M.D.   DATE OF BIRTH:  1927/02/07   DATE OF PROCEDURE:  DATE OF DISCHARGE:                                 OPERATIVE REPORT   Audio too short to transcribe (less than 5 seconds)      Feliberto Gottron. Turner Daniels, M.D.     Ovid Curd  D:  07/01/2006  T:  07/01/2006  Job:  [id number]

## 2013-01-18 ENCOUNTER — Ambulatory Visit: Payer: Self-pay | Admitting: Pulmonary Disease

## 2013-02-07 ENCOUNTER — Ambulatory Visit: Payer: Self-pay | Admitting: Pulmonary Disease

## 2013-02-25 ENCOUNTER — Ambulatory Visit: Payer: Self-pay | Admitting: Pulmonary Disease

## 2015-09-12 ENCOUNTER — Encounter (HOSPITAL_COMMUNITY): Payer: Self-pay | Admitting: *Deleted

## 2015-09-12 ENCOUNTER — Inpatient Hospital Stay (HOSPITAL_COMMUNITY)
Admission: EM | Admit: 2015-09-12 | Discharge: 2015-09-16 | DRG: 603 | Disposition: A | Discharge status: Skilled Nursing Facility | Payer: Medicare PPO | Attending: Internal Medicine | Admitting: Internal Medicine

## 2015-09-12 ENCOUNTER — Emergency Department (HOSPITAL_COMMUNITY): Payer: Medicare PPO

## 2015-09-12 DIAGNOSIS — K219 Gastro-esophageal reflux disease without esophagitis: Secondary | ICD-10-CM | POA: Diagnosis present

## 2015-09-12 DIAGNOSIS — Z8614 Personal history of Methicillin resistant Staphylococcus aureus infection: Secondary | ICD-10-CM

## 2015-09-12 DIAGNOSIS — E785 Hyperlipidemia, unspecified: Secondary | ICD-10-CM | POA: Diagnosis present

## 2015-09-12 DIAGNOSIS — F329 Major depressive disorder, single episode, unspecified: Secondary | ICD-10-CM | POA: Diagnosis present

## 2015-09-12 DIAGNOSIS — Z96651 Presence of right artificial knee joint: Secondary | ICD-10-CM | POA: Diagnosis present

## 2015-09-12 DIAGNOSIS — F419 Anxiety disorder, unspecified: Secondary | ICD-10-CM | POA: Diagnosis present

## 2015-09-12 DIAGNOSIS — Z8619 Personal history of other infectious and parasitic diseases: Secondary | ICD-10-CM

## 2015-09-12 DIAGNOSIS — Z96659 Presence of unspecified artificial knee joint: Secondary | ICD-10-CM

## 2015-09-12 DIAGNOSIS — I739 Peripheral vascular disease, unspecified: Secondary | ICD-10-CM | POA: Diagnosis present

## 2015-09-12 DIAGNOSIS — I10 Essential (primary) hypertension: Secondary | ICD-10-CM | POA: Diagnosis present

## 2015-09-12 DIAGNOSIS — I878 Other specified disorders of veins: Secondary | ICD-10-CM | POA: Diagnosis present

## 2015-09-12 DIAGNOSIS — R5381 Other malaise: Secondary | ICD-10-CM

## 2015-09-12 DIAGNOSIS — F0390 Unspecified dementia without behavioral disturbance: Secondary | ICD-10-CM | POA: Diagnosis present

## 2015-09-12 DIAGNOSIS — Z792 Long term (current) use of antibiotics: Secondary | ICD-10-CM

## 2015-09-12 DIAGNOSIS — M353 Polymyalgia rheumatica: Secondary | ICD-10-CM | POA: Diagnosis present

## 2015-09-12 DIAGNOSIS — M8580 Other specified disorders of bone density and structure, unspecified site: Secondary | ICD-10-CM | POA: Diagnosis present

## 2015-09-12 DIAGNOSIS — E860 Dehydration: Secondary | ICD-10-CM | POA: Diagnosis present

## 2015-09-12 DIAGNOSIS — L03115 Cellulitis of right lower limb: Principal | ICD-10-CM | POA: Diagnosis present

## 2015-09-12 DIAGNOSIS — A4901 Methicillin susceptible Staphylococcus aureus infection, unspecified site: Secondary | ICD-10-CM | POA: Diagnosis present

## 2015-09-12 DIAGNOSIS — R531 Weakness: Secondary | ICD-10-CM | POA: Diagnosis not present

## 2015-09-12 DIAGNOSIS — M009 Pyogenic arthritis, unspecified: Secondary | ICD-10-CM | POA: Diagnosis present

## 2015-09-12 DIAGNOSIS — T8459XA Infection and inflammatory reaction due to other internal joint prosthesis, initial encounter: Secondary | ICD-10-CM

## 2015-09-12 HISTORY — DX: Dyspnea, unspecified: R06.00

## 2015-09-12 HISTORY — DX: Unspecified dementia, unspecified severity, without behavioral disturbance, psychotic disturbance, mood disturbance, and anxiety: F03.90

## 2015-09-12 HISTORY — DX: Major depressive disorder, single episode, unspecified: F32.9

## 2015-09-12 HISTORY — DX: Peripheral vascular disease, unspecified: I73.9

## 2015-09-12 HISTORY — DX: Allergic rhinitis, unspecified: J30.9

## 2015-09-12 HISTORY — DX: Occlusion and stenosis of unspecified carotid artery: I65.29

## 2015-09-12 HISTORY — DX: Gastro-esophageal reflux disease without esophagitis: K21.9

## 2015-09-12 HISTORY — DX: Pressure ulcer of unspecified site, unspecified stage: L89.90

## 2015-09-12 HISTORY — DX: Rheumatoid arthritis, unspecified: M06.9

## 2015-09-12 HISTORY — DX: Other specified disorders of bone density and structure, unspecified site: M85.80

## 2015-09-12 HISTORY — DX: Benign paroxysmal vertigo, unspecified ear: H81.10

## 2015-09-12 HISTORY — DX: Polyneuropathy, unspecified: G62.9

## 2015-09-12 HISTORY — DX: Essential (primary) hypertension: I10

## 2015-09-12 HISTORY — DX: Hyperlipidemia, unspecified: E78.5

## 2015-09-12 HISTORY — DX: Pulmonary fibrosis, unspecified: J84.10

## 2015-09-12 HISTORY — DX: Anxiety disorder, unspecified: F41.9

## 2015-09-12 HISTORY — DX: Collagenous colitis: K52.831

## 2015-09-12 HISTORY — DX: Depression, unspecified: F32.A

## 2015-09-12 HISTORY — DX: Polymyalgia rheumatica: M35.3

## 2015-09-12 HISTORY — DX: Unspecified osteoarthritis, unspecified site: M19.90

## 2015-09-12 HISTORY — DX: Unspecified protein-calorie malnutrition: E46

## 2015-09-12 HISTORY — DX: Vitamin D deficiency, unspecified: E55.9

## 2015-09-12 HISTORY — DX: Muscle weakness (generalized): M62.81

## 2015-09-12 LAB — CBC
HCT: 41.7 % (ref 36.0–46.0)
Hemoglobin: 14.2 g/dL (ref 12.0–15.0)
MCH: 30.8 pg (ref 26.0–34.0)
MCHC: 34.1 g/dL (ref 30.0–36.0)
MCV: 90.5 fL (ref 78.0–100.0)
PLATELETS: 215 10*3/uL (ref 150–400)
RBC: 4.61 MIL/uL (ref 3.87–5.11)
RDW: 12.1 % (ref 11.5–15.5)
WBC: 6.8 10*3/uL (ref 4.0–10.5)

## 2015-09-12 LAB — URINALYSIS, ROUTINE W REFLEX MICROSCOPIC
Bilirubin Urine: NEGATIVE
Glucose, UA: NEGATIVE mg/dL
Hgb urine dipstick: NEGATIVE
Ketones, ur: NEGATIVE mg/dL
Leukocytes, UA: NEGATIVE
Nitrite: NEGATIVE
Protein, ur: NEGATIVE mg/dL
Specific Gravity, Urine: 1.017 (ref 1.005–1.030)
Urobilinogen, UA: 0.2 mg/dL (ref 0.0–1.0)
pH: 6 (ref 5.0–8.0)

## 2015-09-12 LAB — BASIC METABOLIC PANEL
Anion gap: 12 (ref 5–15)
BUN: 19 mg/dL (ref 6–20)
CALCIUM: 10.4 mg/dL — AB (ref 8.9–10.3)
CO2: 26 mmol/L (ref 22–32)
CREATININE: 1.2 mg/dL — AB (ref 0.44–1.00)
Chloride: 97 mmol/L — ABNORMAL LOW (ref 101–111)
GFR calc non Af Amer: 40 mL/min — ABNORMAL LOW (ref >60)
GFR, EST AFRICAN AMERICAN: 46 mL/min — AB (ref >60)
Glucose, Bld: 122 mg/dL — ABNORMAL HIGH (ref 65–99)
Potassium: 3.9 mmol/L (ref 3.5–5.1)
SODIUM: 135 mmol/L (ref 135–145)

## 2015-09-12 LAB — BRAIN NATRIURETIC PEPTIDE: B Natriuretic Peptide: 44.3 pg/mL (ref 0.0–100.0)

## 2015-09-12 LAB — I-STAT CHEM 8, ED
BUN: 23 mg/dL — ABNORMAL HIGH (ref 6–20)
Calcium, Ion: 1.29 mmol/L (ref 1.13–1.30)
Chloride: 95 mmol/L — ABNORMAL LOW (ref 101–111)
Creatinine, Ser: 1.3 mg/dL — ABNORMAL HIGH (ref 0.44–1.00)
Glucose, Bld: 121 mg/dL — ABNORMAL HIGH (ref 65–99)
HCT: 46 % (ref 36.0–46.0)
Hemoglobin: 15.6 g/dL — ABNORMAL HIGH (ref 12.0–15.0)
Potassium: 3.8 mmol/L (ref 3.5–5.1)
Sodium: 135 mmol/L (ref 135–145)
TCO2: 26 mmol/L (ref 0–100)

## 2015-09-12 NOTE — ED Provider Notes (Signed)
CSN: [id number]     Arrival date & time 09/12/15  2021 History   First MD Initiated Contact with Patient 09/12/15 2041     Chief Complaint  Patient presents with  . Weakness  . Shortness of Breath     (Consider location/radiation/quality/duration/timing/severity/associated sxs/prior Treatment) HPI   81 y.o. female presents to the ED with c/o generalized weakness, malaise, and progressive inability to ambulate for the past 4 days. Her daughter who has RA with deformities to fingers that are obvious to me on meeting her in the room, states that she cannot take care of the patient like this. Patient does have dementia and had been declining somewhat over the past 1 year but dramatically declined over the past 4 days.  Past Medical History  Diagnosis Date  . Hypertension   . Malnutrition (HCC)   . Muscle weakness   . Depression   . Decubitus ulcer   . Dyspnea   . Polymyalgia rheumatica (HCC)   . Hyperlipidemia   . Rheumatoid arthritis (HCC)   . Osteoarthritis   . Benign positional vertigo   . Dementia   . Pulmonary fibrosis (HCC)   . Osteopenia   . Allergic rhinitis   . Neuropathy (HCC)   . Anxiety   . Carotid stenosis   . Collagenous colitis   . GERD (gastroesophageal reflux disease)   . PVD (peripheral vascular disease) (HCC)   . Vitamin D deficiency    History reviewed. No pertinent past surgical history. History reviewed. No pertinent family history. Social History  Substance Use Topics  . Smoking status: Never Smoker   . Smokeless tobacco: None  . Alcohol Use: No   OB History    No data available     Review of Systems  All systems reviewed and negative, other than as noted in HPI.   Allergies  Review of patient's allergies indicates no known allergies.  Home Medications   Prior to Admission medications   Medication Sig Start Date End Date Taking? Authorizing Provider  alendronate (FOSAMAX) 70 MG tablet Take 70 mg by mouth every Sunday. Take with a  full glass of water on an empty stomach.   Yes Historical Provider, MD  ALPRAZolam Prudy Feeler) 0.5 MG tablet Take 0.5 mg by mouth 2 (two) times daily.    Yes Historical Provider, MD  amLODipine (NORVASC) 5 MG tablet Take 5 mg by mouth daily.   Yes Historical Provider, MD  aspirin 325 MG EC tablet Take 325 mg by mouth daily.   Yes Historical Provider, MD  chlorpheniramine (CHLORPHEN) 4 MG tablet Take 8 mg by mouth 2 (two) times daily.    Yes Historical Provider, MD  Dextromethorphan-Guaifenesin (MUCUS RELIEF DM COUGH PO) Take 1 tablet by mouth at bedtime.    Yes Historical Provider, MD  doxycycline (DORYX) 100 MG EC tablet Take 100 mg by mouth daily.   Yes Historical Provider, MD  lansoprazole (PREVACID) 30 MG capsule Take 30 mg by mouth daily at 12 noon.   Yes Historical Provider, MD  losartan-hydrochlorothiazide (HYZAAR) 100-25 MG tablet Take 1 tablet by mouth daily.   Yes Historical Provider, MD  Multiple Vitamin (MULTIVITAMIN WITH MINERALS) TABS tablet Take 1 tablet by mouth daily.   Yes Historical Provider, MD  traMADol (ULTRAM) 50 MG tablet Take 50 mg by mouth 2 (two) times daily.    Yes Historical Provider, MD  traZODone (DESYREL) 100 MG tablet Take 100 mg by mouth at bedtime.   Yes Historical Provider, MD   BP  116/62 mmHg  Pulse 96  Temp(Src) 99.2 F (37.3 C) (Rectal)  Resp 20  SpO2 99% Physical Exam  Constitutional: She appears well-developed and well-nourished. No distress.  Sitting up in bed. NAD.   HENT:  Head: Normocephalic and atraumatic.  Eyes: Conjunctivae are normal. Right eye exhibits no discharge. Left eye exhibits no discharge.  Neck: Neck supple.  Cardiovascular: Normal rate, regular rhythm and normal heart sounds.  Exam reveals no gallop and no friction rub.   No murmur heard. Pulmonary/Chest: Effort normal and breath sounds normal. No respiratory distress.  Abdominal: Soft. She exhibits no distension. There is no tenderness.  Musculoskeletal: She exhibits no edema or  tenderness.  Skin changes to bilateral shins consistent with stasis dermatitis. I feel cellulitis is less likely.  Neurological: She is alert. No cranial nerve deficit. She exhibits normal muscle tone. Coordination normal.  Speech clear. Content appropriate. Follows commands. No focal motor deficits.   Skin: Skin is warm and dry.  Psychiatric: She has a normal mood and affect. Her behavior is normal. Thought content normal.  Nursing note and vitals reviewed.   ED Course  Procedures (including critical care time) Labs Review Labs Reviewed  BASIC METABOLIC PANEL - Abnormal; Notable for the following:    Chloride 97 (*)    Glucose, Bld 122 (*)    Creatinine, Ser 1.20 (*)    Calcium 10.4 (*)    GFR calc non Af Amer 40 (*)    GFR calc Af Amer 46 (*)    All other components within normal limits  I-STAT CHEM 8, ED - Abnormal; Notable for the following:    Chloride 95 (*)    BUN 23 (*)    Creatinine, Ser 1.30 (*)    Glucose, Bld 121 (*)    Hemoglobin 15.6 (*)    All other components within normal limits  CBC  BRAIN NATRIURETIC PEPTIDE  URINALYSIS, ROUTINE W REFLEX MICROSCOPIC (NOT AT [facility])    Imaging Review Dg Chest 2 View  09/12/2015   CLINICAL DATA:  Shortness of breath for several days.  Hypertension.  EXAM: CHEST  2 VIEW  COMPARISON:  July 03, 2006  FINDINGS: There is mild elevation of the left hemidiaphragm. There is atelectatic change in the left base. The lungs are otherwise clear. Heart size and pulmonary vascularity are normal. There is atherosclerotic change in the aorta. No adenopathy. 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 There are chronic rotator cuff tears bilaterally. There is thoracolumbar levoscoliosis.  IMPRESSION: Atelectasis left base. No edema or consolidation. Heart size within normal limits. Extensive arterial vascular calcification. Avascular necrosis left humeral head. Extensive arthropathy in each  shoulder with chronic rotator cuff tears bilaterally.   Electronically Signed   By: Bretta Bang III M.D.   On: 09/12/2015 21:38   I have personally reviewed and evaluated these images and lab results as part of my medical decision-making.   EKG Interpretation   Date/Time:  Wednesday September 12 2015 20:32:02 EDT Ventricular Rate:  97 PR Interval:  154 QRS Duration: 93 QT Interval:  345 QTC Calculation: 438 R Axis:   -59 Text Interpretation:  Sinus rhythm Left anterior fascicular block Anterior  infarct, old Confirmed by Tiyona Desouza  MD, Ermalee Mealy ([id number]) on 09/12/2015 10:41:12  PM      MDM   Final diagnoses:  Dehydration  Physical deconditioning    81 year old female brought in by family for evaluation of generalized weakness. Patient really has  no acute complaints to me aside from feeling generally tired. Patient lives at home with her daughter and they have and aid. Patient's weakness has progressed to the point that they cannot currently take care of her with regards to mobility/transfering. Patient has had a continued decline over the past year. Suspect a lot of this is simply deconditioning with perhaps some element of dehydration. She has no acute focal deficits. Family is wanting to pursue placement. Explained to them that this realistically cannot be accomplished in the emergency room this time a day. Admission will likely only be for observation overnight for mild dehydration.   Raeford Razor, MD 09/20/15 [phone]

## 2015-09-12 NOTE — ED Notes (Signed)
Pt to ED from home c/o weakness and sob x 4 days. Hx of CHF, rales noted to lower lobes per EMS. Pt reports weakness and shortness of breath worsening on exertion

## 2015-09-13 DIAGNOSIS — Z96659 Presence of unspecified artificial knee joint: Secondary | ICD-10-CM

## 2015-09-13 DIAGNOSIS — T8459XD Infection and inflammatory reaction due to other internal joint prosthesis, subsequent encounter: Secondary | ICD-10-CM | POA: Diagnosis not present

## 2015-09-13 DIAGNOSIS — M009 Pyogenic arthritis, unspecified: Secondary | ICD-10-CM | POA: Diagnosis not present

## 2015-09-13 DIAGNOSIS — I10 Essential (primary) hypertension: Secondary | ICD-10-CM

## 2015-09-13 DIAGNOSIS — T8459XA Infection and inflammatory reaction due to other internal joint prosthesis, initial encounter: Secondary | ICD-10-CM

## 2015-09-13 DIAGNOSIS — A4901 Methicillin susceptible Staphylococcus aureus infection, unspecified site: Secondary | ICD-10-CM | POA: Diagnosis present

## 2015-09-13 DIAGNOSIS — E860 Dehydration: Secondary | ICD-10-CM | POA: Diagnosis not present

## 2015-09-13 DIAGNOSIS — L03115 Cellulitis of right lower limb: Secondary | ICD-10-CM | POA: Diagnosis present

## 2015-09-13 DIAGNOSIS — R5381 Other malaise: Secondary | ICD-10-CM

## 2015-09-13 MED ORDER — LOSARTAN POTASSIUM-HCTZ 100-25 MG PO TABS: 1.0000 | ORAL_TABLET | Freq: Every day | ORAL | Status: DC

## 2015-09-13 MED ORDER — SODIUM CHLORIDE 0.9 % IV SOLN
INTRAVENOUS | Status: DC
  Administered 2015-09-13 – 2015-09-15 (×4): via INTRAVENOUS

## 2015-09-13 MED ORDER — PANTOPRAZOLE SODIUM 40 MG PO TBEC
40.0000 mg | DELAYED_RELEASE_TABLET | Freq: Every day | ORAL | Status: DC
  Administered 2015-09-13 – 2015-09-16 (×4): 40 mg via ORAL
  Filled 2015-09-13 (×4): qty 1

## 2015-09-13 MED ORDER — LOSARTAN POTASSIUM 50 MG PO TABS
100.0000 mg | ORAL_TABLET | Freq: Every day | ORAL | Status: DC
  Administered 2015-09-13 – 2015-09-16 (×4): 100 mg via ORAL
  Filled 2015-09-13 (×4): qty 2

## 2015-09-13 MED ORDER — AMLODIPINE BESYLATE 5 MG PO TABS
5.0000 mg | ORAL_TABLET | Freq: Every day | ORAL | Status: DC
  Administered 2015-09-13 – 2015-09-16 (×4): 5 mg via ORAL
  Filled 2015-09-13 (×4): qty 1

## 2015-09-13 MED ORDER — ASPIRIN EC 325 MG PO TBEC
325.0000 mg | DELAYED_RELEASE_TABLET | Freq: Every day | ORAL | Status: DC
  Administered 2015-09-13 – 2015-09-16 (×4): 325 mg via ORAL
  Filled 2015-09-13 (×4): qty 1

## 2015-09-13 MED ORDER — TRAZODONE HCL 100 MG PO TABS
100.0000 mg | ORAL_TABLET | Freq: Every day | ORAL | Status: DC
  Administered 2015-09-13 – 2015-09-15 (×4): 100 mg via ORAL
  Filled 2015-09-13 (×4): qty 1

## 2015-09-13 MED ORDER — ALPRAZOLAM 0.5 MG PO TABS
0.5000 mg | ORAL_TABLET | Freq: Two times a day (BID) | ORAL | Status: DC
  Administered 2015-09-13 – 2015-09-16 (×8): 0.5 mg via ORAL
  Filled 2015-09-13 (×8): qty 1

## 2015-09-13 MED ORDER — ENSURE ENLIVE PO LIQD
237.0000 mL | Freq: Two times a day (BID) | ORAL | Status: DC
  Administered 2015-09-13 – 2015-09-16 (×6): 237 mL via ORAL

## 2015-09-13 MED ORDER — HYDROCHLOROTHIAZIDE 25 MG PO TABS
25.0000 mg | ORAL_TABLET | Freq: Every day | ORAL | Status: DC
  Administered 2015-09-13 – 2015-09-16 (×4): 25 mg via ORAL
  Filled 2015-09-13 (×4): qty 1

## 2015-09-13 MED ORDER — ADULT MULTIVITAMIN W/MINERALS CH
1.0000 | ORAL_TABLET | Freq: Every day | ORAL | Status: DC
  Administered 2015-09-13 – 2015-09-16 (×4): 1 via ORAL
  Filled 2015-09-13 (×4): qty 1

## 2015-09-13 MED ORDER — VANCOMYCIN HCL IN DEXTROSE 1-5 GM/200ML-% IV SOLN
1000.0000 mg | INTRAVENOUS | Status: DC
  Administered 2015-09-13 – 2015-09-15 (×3): 1000 mg via INTRAVENOUS
  Filled 2015-09-13 (×4): qty 200

## 2015-09-13 MED ORDER — TRAMADOL HCL 50 MG PO TABS
50.0000 mg | ORAL_TABLET | Freq: Two times a day (BID) | ORAL | Status: DC
  Administered 2015-09-13 – 2015-09-16 (×8): 50 mg via ORAL
  Filled 2015-09-13 (×8): qty 1

## 2015-09-13 MED ORDER — SODIUM CHLORIDE 0.9 % IV SOLN
1250.0000 mg | INTRAVENOUS | Status: DC
  Filled 2015-09-13: qty 1250

## 2015-09-13 MED ORDER — DOXYCYCLINE HYCLATE 100 MG PO TBEC: 100.0000 mg | DELAYED_RELEASE_TABLET | Freq: Every day | ORAL | Status: DC

## 2015-09-13 MED ORDER — HEPARIN SODIUM (PORCINE) 5000 UNIT/ML IJ SOLN
5000.0000 [IU] | Freq: Three times a day (TID) | INTRAMUSCULAR | Status: DC
  Administered 2015-09-13 – 2015-09-16 (×11): 5000 [IU] via SUBCUTANEOUS
  Filled 2015-09-13 (×8): qty 1

## 2015-09-13 NOTE — Progress Notes (Signed)
ANTIBIOTIC CONSULT NOTE - INITIAL  Pharmacy Consult for vancomycin Indication: cellulitis  No Known Allergies  Patient Measurements:   Adjusted Body Weight:   Vital Signs: Temp: 99.2 F (37.3 C) (10/05 2321) Temp Source: Rectal (10/05 2321) BP: 117/69 mmHg (10/06 0039) Pulse Rate: 100 (10/06 0039) Intake/Output from previous day:   Intake/Output from this shift:    Labs:  Recent Labs  09/12/15 2100 09/12/15 2107  WBC 6.8  --   HGB 14.2 15.6*  PLT 215  --   CREATININE 1.20* 1.30*   CrCl cannot be calculated (Unknown ideal weight.). No results for input(s): VANCOTROUGH, VANCOPEAK, VANCORANDOM, GENTTROUGH, GENTPEAK, GENTRANDOM, TOBRATROUGH, TOBRAPEAK, TOBRARND, AMIKACINPEAK, AMIKACINTROU, AMIKACIN in the last 72 hours.   Microbiology: No results found for this or any previous visit (from the past 720 hour(s)).  Medical History: Past Medical History  Diagnosis Date  . Hypertension   . Malnutrition (HCC)   . Muscle weakness   . Depression   . Decubitus ulcer   . Dyspnea   . Polymyalgia rheumatica (HCC)   . Hyperlipidemia   . Rheumatoid arthritis (HCC)   . Osteoarthritis   . Benign positional vertigo   . Dementia   . Pulmonary fibrosis (HCC)   . Osteopenia   . Allergic rhinitis   . Neuropathy (HCC)   . Anxiety   . Carotid stenosis   . Collagenous colitis   . GERD (gastroesophageal reflux disease)   . PVD (peripheral vascular disease) (HCC)   . Vitamin D deficiency     Medications:  Anti-infectives    Start     Dose/Rate Route Frequency Ordered Stop   09/13/15 1000  doxycycline (DORYX) EC tablet 100 mg  Status:  Discontinued     100 mg Oral Daily 09/13/15 0005 09/13/15 0035   09/13/15 0100  vancomycin (VANCOCIN) 1,250 mg in sodium chloride 0.9 % 250 mL IVPB     1,250 mg 166.7 mL/hr over 90 Minutes Intravenous Every 24 hours 09/13/15 0046       Assessment: 88 yof presented to the ED with weakness. 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 Now to start vancomycin for possible cellulitis. Tmax is 99.2 and WBC is WNL. Scr is elevated at 1.3.  Vanc 10/6>>  Goal of Therapy:  Vancomycin trough level 10-15 mcg/ml  Plan:  - Vancomycin  IV Q24H - F/u renal fxn, C&S, clinical status and trough at [facility]  Danielly Ackerley, Drake Leach 09/13/2015,12:46 AM

## 2015-09-13 NOTE — H&P (Signed)
[facility] History and Physical  [name] WNU:[id number] DOB: [birth year] DOA: 09/12/2015  Referring physician: EDP PCP: Martha Clan, MD   Chief Complaint: Weakness   HPI: [name] is a 81 y.o. 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  Patient presents to the ED with c/o generalized weakness, malaise, and progressive inability to ambulate for the past 4 days.  Her daughter who has RA with deformities to fingers that are obvious to me on meeting her in the room, states that she cannot take care of the patient like this.  Patient does have dementia and had been declining somewhat over the past 1 year but dramatically declined over the past 4 days.  Regarding her SA infection of the R knee joint: she was kept on suppressive keflex from 2006 until the end of last year (2015) when she developed inflammation and pain in the R knee joint.  Daughter states that patients orthopaedic surgeon said that keflex wasn't working anymore (bacteria had become resistant), and switched patient to doxycycline once a day.  Knee inflammation went down shortly there after and she has been doing well until 4 days ago.  Since then patient has also developed warmth, pain, and erythema of her R shin around an area that she has chronic venous stasis changes of (R knee is not inflamed specifically though).  Review of Systems: Systems reviewed.  As above, otherwise negative  Past Medical History  Diagnosis Date  . Hypertension   . Malnutrition (HCC)   . Muscle weakness   . Depression   . Decubitus ulcer   . Dyspnea   . Polymyalgia rheumatica (HCC)   . Hyperlipidemia   . Rheumatoid arthritis (HCC)   . Osteoarthritis   . Benign positional vertigo   . Dementia   . Pulmonary fibrosis (HCC)   . Osteopenia   . Allergic rhinitis   . Neuropathy (HCC)   . Anxiety   . Carotid stenosis   . Collagenous colitis   . GERD (gastroesophageal reflux disease)   .  PVD (peripheral vascular disease) (HCC)   . Vitamin D deficiency    History reviewed. No pertinent past surgical history. Social History:  reports that she has never smoked. She does not have any smokeless tobacco history on file. She reports that she does not drink alcohol or use illicit drugs.  No Known Allergies  History reviewed. No pertinent family history.   Prior to Admission medications   Medication Sig Start Date End Date Taking? Authorizing Provider  alendronate (FOSAMAX) 70 MG tablet Take 70 mg by mouth every Sunday. Take with a full glass of water on an empty stomach.   Yes Historical Provider, MD  ALPRAZolam Prudy Feeler) 0.5 MG tablet Take 0.5 mg by mouth 2 (two) times daily.    Yes Historical Provider, MD  amLODipine (NORVASC) 5 MG tablet Take 5 mg by mouth daily.   Yes Historical Provider, MD  aspirin 325 MG EC tablet Take 325 mg by mouth daily.   Yes Historical Provider, MD  chlorpheniramine (CHLORPHEN) 4 MG tablet Take 8 mg by mouth 2 (two) times daily.    Yes Historical Provider, MD  Dextromethorphan-Guaifenesin (MUCUS RELIEF DM COUGH PO) Take 1 tablet by mouth at bedtime.    Yes Historical Provider, MD  doxycycline (DORYX) 100 MG EC tablet Take 100 mg by mouth daily.   Yes Historical Provider, MD  lansoprazole (PREVACID) 30 MG capsule Take 30 mg by mouth daily  at 12 noon.   Yes Historical Provider, MD  losartan-hydrochlorothiazide (HYZAAR) 100-25 MG tablet Take 1 tablet by mouth daily.   Yes Historical Provider, MD  Multiple Vitamin (MULTIVITAMIN WITH MINERALS) TABS tablet Take 1 tablet by mouth daily.   Yes Historical Provider, MD  traMADol (ULTRAM) 50 MG tablet Take 50 mg by mouth 2 (two) times daily.    Yes Historical Provider, MD  traZODone (DESYREL) 100 MG tablet Take 100 mg by mouth at bedtime.   Yes Historical Provider, MD   Physical Exam: Filed Vitals:   09/12/15 2321  BP:   Pulse:   Temp: 99.2 F (37.3 C)  Resp:     BP 116/62 mmHg  Pulse 96  Temp(Src) 99.2  F (37.3 C) (Rectal)  Resp 20  SpO2 99%  General Appearance:    Alert, oriented, no distress, appears stated age  Head:    Normocephalic, atraumatic  Eyes:    PERRL, EOMI, sclera non-icteric        Nose:   Nares without drainage or epistaxis. Mucosa, turbinates normal  Throat:   Moist mucous membranes. Oropharynx without erythema or exudate.  Neck:   Supple. No carotid bruits.  No thyromegaly.  No lymphadenopathy.   Back:     No CVA tenderness, no spinal tenderness  Lungs:     Clear to auscultation bilaterally, without wheezes, rhonchi or rales  Chest wall:    No tenderness to palpitation  Heart:    Regular rate and rhythm without murmurs, gallops, rubs  Abdomen:     Soft, non-tender, nondistended, normal bowel sounds, no organomegaly  Genitalia:    deferred  Rectal:    deferred  Extremities:   No clubbing, cyanosis or edema.  Pulses:   2+ and symmetric all extremities  Skin:   BLE chronic venous stasis changes.  Patient has erythema surrounding the venous stasis changes on the RLE which family states is new over the past couple of days.     Neurologic:   CNII-XII intact. Normal strength, sensation and reflexes      throughout    Labs on Admission:  Basic Metabolic Panel:  Recent Labs Lab 09/12/15 2100 09/12/15 2107  NA 135 135  K 3.9 3.8  CL 97* 95*  CO2 26  --   GLUCOSE 122* 121*  BUN 19 23*  CREATININE 1.20* 1.30*  CALCIUM 10.4*  --    Liver Function Tests: No results for input(s): AST, ALT, ALKPHOS, BILITOT, PROT, ALBUMIN in the last 168 hours. No results for input(s): LIPASE, AMYLASE in the last 168 hours. No results for input(s): AMMONIA in the last 168 hours. CBC:  Recent Labs Lab 09/12/15 2100 09/12/15 2107  WBC 6.8  --   HGB 14.2 15.6*  HCT 41.7 46.0  MCV 90.5  --   PLT 215  --    Cardiac Enzymes: No results for input(s): CKTOTAL, CKMB, CKMBINDEX, TROPONINI in the last 168 hours.  BNP (last 3 results) No results for input(s): PROBNP in the last  8760 hours. CBG: No results for input(s): GLUCAP in the last 168 hours.  Radiological Exams on Admission: Dg Chest 2 View  09/12/2015   CLINICAL DATA:  Shortness of breath for several days.  Hypertension.  EXAM: CHEST  2 VIEW  COMPARISON:  July 03, 2006  FINDINGS: There is mild elevation of the left hemidiaphragm. There is atelectatic change in the left base. The lungs are otherwise clear. Heart size and pulmonary vascularity are normal. There is atherosclerotic change  in the aorta. No adenopathy. 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 There are chronic rotator cuff tears bilaterally. There is thoracolumbar levoscoliosis.  IMPRESSION: Atelectasis left base. No edema or consolidation. Heart size within normal limits. Extensive arterial vascular calcification. Avascular necrosis left humeral head. Extensive arthropathy in each shoulder with chronic rotator cuff tears bilaterally.   Electronically Signed   By: Bretta Bang III M.D.   On: 09/12/2015 21:38    EKG: Independently reviewed.  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 Cellulitis of RLE - mild temperature of 99.3, mild tachycardia to 103, new erythema, tenderness, and warmth to area around chronic venous stasis skin changes. 1. Will go ahead and empirically treat this. 2. Given the known history of MSSA that has been suspected to convert to MRSA in her R knee (see HPI), and the fact that this developed while on chronic doxycycline, feel that it is appropriate to use vancomycin empirically in this patient. 3. Will hold doxycycline while patient on vancomycin 4. BCx pending 2. HTN - continue home meds 3. Dehydration - gentle  hydration    Code Status: Full Code  Family Communication: Family at bedside Disposition Plan: Admit to obs   Time spent: 70 min  Favian Kittleson M. [facility] Pager [phone]  If 7AM-7PM, please contact the day team taking care of the patient [email or web address] Password [email or web address] 09/13/2015, 12:38 AM

## 2015-09-13 NOTE — Care Management Note (Signed)
Case Management Note  Patient Details  Name: [name] MRN: [id number] Date of Birth: June 03, 1927  Subjective/Objective:    Date: 09/13/15 Spoke with patient at the bedside along with friend, Mel, who patient states it is ok for her to stay in the room during our conversation. Introduced self as Sports coach and explained role in discharge planning and how to be reached. Verified patient lives in town, alone with daughter, has DME rolling walker, hosp bed, bsc, and transport chair. Expressed no potential need for no other DME. Verified patient anticipates to go home with family.  NCM informed patient per pt eval rec SNF, since she is 2 plus max ast and not walking at time of discharge.  Patient states her daughter gives her full time supervison at this time to best of their knowledge. Patient r denied needing help with their medication. Patient is driven by daughter to MD appointments. Verified patient has PCP Clelia Croft. Per RN Mckinley Jewel, patient 's duaghter Erskine Squibb is caregiver and would like for her to go to snf.   NCM informed CSW of this information.    Plan: CM will continue to follow for discharge planning and [facility] resources.                 Action/Plan:   Expected Discharge Date:                  Expected Discharge Plan:  Skilled Nursing Facility  In-House Referral:  Clinical Social Work  Discharge planning Services  CM Consult  Post Acute Care Choice:    Choice offered to:     DME Arranged:    DME Agency:     HH Arranged:    HH Agency:     Status of Service:  In process, will continue to follow  Medicare Important Message Given:    Date Medicare IM Given:    Medicare IM give by:    Date Additional Medicare IM Given:    Additional Medicare Important Message give by:     If discussed at Long Length of Stay Meetings, dates discussed:    Additional Comments:  Leone Haven, RN 09/13/2015, 4:21 PM

## 2015-09-13 NOTE — Consult Note (Signed)
Reason for Consult: Swollen RTKA Referring Physician:   NYGERIA Arnold is an 81 y.o. female.  HPI: .  [name] was admitted through ER last nite because of weakiness,  And progressive trouble walking. Her daughter reports that she had right knee swelling last night, but it resolved during the day.Her admission CBC had a normal white count.To recap she had a Right high tibial osteotomy in 1986, a complex right total knee arthroplasty done by Dr. Laurina Bustle in 1996, the primary left total knee done by Dr. Rhona Raider in 2006.  I met her in December of 2006 at the request of Dr. Rhona Raider for increased pain in her right knee.  I aspirated 30 cc of brownish fluid.  The cell count was 26,000, but never grew anything on multiple cultures.  Her x-ray showed that the patella was tilted 90 sideways on the lateral view, and on 12/17/2005.  I removed the malpositioned patellar component and revised it to a # 35 mm Sigma RP patellar button.  She also had an extensive synovectomy, that also grew out nothing.  She has a long history of rheumatoid arthritis and was felt that this represented a rheumatoid flare.  Prior to revision she also had a second aspiration that grew nothing.  In July of 2007 I aspirated what looked like purulent material from the knee that also grew out nothing but she was taken for radical irrigation and debridement that also grew out nothing.  Since that surgery she has been on Keflex twice a day as a suppressive agent, although she had never grown anything out of her fluid.  She was seen by Dr. Rip Harbour on 07/12/2014 for cellulitis and an area of swelling to the posterior lateral aspect to did not show any fluid on ultrasound evaluation and the x-rays were unremarkable.  She was placed on Cipro and doxycycline and the swelling and pain resolved.  Through 2016 she has benn maintained on doxycyclineShe still is very impressive venous stasis to both lower extremities and is very unlikely she would heal any wound  that extended beyond the metaphyseal flare of either tibia.  Past Medical History  Diagnosis Date  . Hypertension   . Malnutrition ([place])   . Muscle weakness   . Depression   . Decubitus ulcer   . Dyspnea   . Polymyalgia rheumatica ([place])   . Hyperlipidemia   . Rheumatoid arthritis ([place])   . Osteoarthritis   . Benign positional vertigo   . Dementia   . Pulmonary fibrosis ([place])   . Osteopenia   . Allergic rhinitis   . Neuropathy ([place])   . Anxiety   . Carotid stenosis   . Collagenous colitis   . GERD (gastroesophageal reflux disease)   . PVD (peripheral vascular disease) ([place])   . Vitamin D deficiency     History reviewed. No pertinent past surgical history.  History reviewed. No pertinent family history.  Social History:  reports that she has never smoked. She does not have any smokeless tobacco history on file. She reports that she does not drink alcohol or use illicit drugs.  Allergies: No Known Allergies  Medications: I have reviewed the patient's current medications.  Results for orders placed or performed during the hospital encounter of 09/12/15 (from the past 48 hour(s))  Basic metabolic panel     Status: Abnormal   Collection Time: 09/12/15  9:00 PM  Result Value Ref Range   Sodium 135 135 - 145 mmol/L   Potassium 3.9 3.5 - 5.1  mmol/L   Chloride 97 (L) 101 - 111 mmol/L   CO2 26 22 - 32 mmol/L   Glucose, Bld 122 (H) 65 - 99 mg/dL   BUN 19 6 - 20 mg/dL   Creatinine, Ser 1.20 (H) 0.44 - 1.00 mg/dL   Calcium 10.4 (H) 8.9 - 10.3 mg/dL   GFR calc non Af Amer 40 (L) >60 mL/min   GFR calc Af Amer 46 (L) >60 mL/min    Comment: (NOTE) The eGFR has been calculated using the CKD EPI equation. This calculation has not been validated in all clinical situations. eGFR's persistently <60 mL/min signify possible Chronic Kidney Disease.    Anion gap 12 5 - 15  CBC     Status: None   Collection Time: 09/12/15  9:00 PM  Result Value Ref Range   WBC 6.8 4.0 - 10.5 K/uL    RBC 4.61 3.87 - 5.11 MIL/uL   Hemoglobin 14.2 12.0 - 15.0 g/dL   HCT 41.7 36.0 - 46.0 %   MCV 90.5 78.0 - 100.0 fL   MCH 30.8 26.0 - 34.0 pg   MCHC 34.1 30.0 - 36.0 g/dL   RDW 12.1 11.5 - 15.5 %   Platelets 215 150 - 400 K/uL  Brain natriuretic peptide     Status: None   Collection Time: 09/12/15  9:00 PM  Result Value Ref Range   B Natriuretic Peptide 44.3 0.0 - 100.0 pg/mL  I-stat chem 8, ed     Status: Abnormal   Collection Time: 09/12/15  9:07 PM  Result Value Ref Range   Sodium 135 135 - 145 mmol/L   Potassium 3.8 3.5 - 5.1 mmol/L   Chloride 95 (L) 101 - 111 mmol/L   BUN 23 (H) 6 - 20 mg/dL   Creatinine, Ser 1.30 (H) 0.44 - 1.00 mg/dL   Glucose, Bld 121 (H) 65 - 99 mg/dL   Calcium, Ion 1.29 1.13 - 1.30 mmol/L   TCO2 26 0 - 100 mmol/L   Hemoglobin 15.6 (H) 12.0 - 15.0 g/dL   HCT 46.0 36.0 - 46.0 %  Urinalysis, Routine w reflex microscopic (not at [facility])     Status: None   Collection Time: 09/12/15 11:20 PM  Result Value Ref Range   Color, Urine YELLOW YELLOW   APPearance CLEAR CLEAR   Specific Gravity, Urine 1.017 1.005 - 1.030   pH 6.0 5.0 - 8.0   Glucose, UA NEGATIVE NEGATIVE mg/dL   Hgb urine dipstick NEGATIVE NEGATIVE   Bilirubin Urine NEGATIVE NEGATIVE   Ketones, ur NEGATIVE NEGATIVE mg/dL   Protein, ur NEGATIVE NEGATIVE mg/dL   Urobilinogen, UA 0.2 0.0 - 1.0 mg/dL   Nitrite NEGATIVE NEGATIVE   Leukocytes, UA NEGATIVE NEGATIVE    Comment: MICROSCOPIC NOT DONE ON URINES WITH NEGATIVE PROTEIN, BLOOD, LEUKOCYTES, NITRITE, OR GLUCOSE <1000 mg/dL.    Dg Chest 2 View  09/12/2015   CLINICAL DATA:  Shortness of breath for several days.  Hypertension.  EXAM: CHEST  2 VIEW  COMPARISON:  July 03, 2006  FINDINGS: There is mild elevation of the left hemidiaphragm. There is atelectatic change in the left base. The lungs are otherwise clear. Heart size and pulmonary vascularity are normal. There is atherosclerotic change in the aorta. No adenopathy. There is extensive arthropathy  in both shoulders with avascular necrosis in the left humeral head. There are chronic rotator cuff tears bilaterally. There is thoracolumbar levoscoliosis.  IMPRESSION: Atelectasis left base. No edema or consolidation. Heart size within normal limits. Extensive  arterial vascular calcification. Avascular necrosis left humeral head. Extensive arthropathy in each shoulder with chronic rotator cuff tears bilaterally.   Electronically Signed   By: Lowella Grip III M.D.   On: 09/12/2015 21:38    ROSPatient denies any chest pain or shortness of breath.   She does admit to chronic weakness Blood pressure 165/70, pulse 84, temperature 98.2 F (36.8 C), temperature source Oral, resp. rate 16, height 5' (1.524 m), weight 79 kg (174 lb 2.6 oz), SpO2 97 %. Physical Exam: No palpable effusion to either total knee wounds are well-healed.  There is no erythema or increased warmth to either knee.  Range of motion on the right knee is 0/80, collateral ligaments are stable.  She has bilateral venous stasis that is impressive and appears to have progressed some.  Since I last saw her.  Assessment/Plan: Assessment: At this time I do not believe she has a clinically active infection in her right total knee which by my records has never actually grown out an organism.  She does have impressive venous stasis and probable cellulitis.  Plan: Agree with antibiotics for the cellulitis.  I would not aspirate her knee at this time.  When she is discharged, I would ask that she be placed back on doxycycline which is relatively benign drug, that she is tolerated well.  Lesslie Mossa J 09/13/2015, 8:06 PM

## 2015-09-13 NOTE — ED Notes (Signed)
Dr. Gardner at bedside 

## 2015-09-13 NOTE — Progress Notes (Signed)
I have seen and assessed patient and agree with Dr Gardner's assessment and plan. 

## 2015-09-13 NOTE — Evaluation (Signed)
Physical Therapy Evaluation Patient Details Name: [name] MRN: [id number] DOB: April 02, 1927 Today's Date: 09/13/2015   History of Present Illness  Pt adm with cellulitis of RLE. PMH - chronic infection of rt TKA, dementia, HTN, RA  Clinical Impression  Pt admitted with above diagnosis and presents to PT with functional limitations due to deficits listed below (See PT problem list). Pt needs skilled PT to maximize independence and safety to allow discharge to SNF.      Follow Up Recommendations SNF    Equipment Recommendations  Other (comment) (To be determined)    Recommendations for Other Services       Precautions / Restrictions Precautions Precautions: Fall      Mobility  Bed Mobility Overal bed mobility: Needs Assistance Bed Mobility: Supine to Sit;Sit to Supine     Supine to sit: +2 for physical assistance;Max assist Sit to supine: +2 for physical assistance;Max assist   General bed mobility comments: Assist to move legs and to elevate and lower trunk.  Transfers Overall transfer level: Needs assistance Equipment used: Ambulation equipment used;Rolling walker (2 wheeled) Transfers: Sit to/from [employer] Transfers Sit to Stand: +2 physical assistance;Max assist Stand pivot transfers: +2 physical assistance;Max assist       General transfer comment: Bed to [facility] with walker with pt having difficulty taking pivotal steps. BSC to bed used Stedy but very difficult to achieve enough of standing to put seat flaps down.  Ambulation/Gait                Stairs            Wheelchair Mobility    Modified Rankin (Stroke Patients Only)       Balance Overall balance assessment: Needs assistance Sitting-balance support: Bilateral upper extremity supported Sitting balance-Leahy Scale: Poor Sitting balance - Comments: upper extremity support   Standing balance support: Bilateral upper extremity supported Standing balance-Leahy Scale:  Zero Standing balance comment: 2 person max assist with walker or stedy                             Pertinent Vitals/Pain Pain Assessment: Faces Faces Pain Scale: Hurts even more Pain Location: Pt unable to localize Pain Intervention(s): Monitored during session;Repositioned;Limited activity within patient's tolerance    Home Living Family/patient expects to be discharged to:: Unsure Living Arrangements: Children                    Prior Function Level of Independence: Needs assistance   Gait / Transfers Assistance Needed: Per chart pt has been unable to amb for the last several days.           Hand Dominance        Extremity/Trunk Assessment   Upper Extremity Assessment: Defer to OT evaluation           Lower Extremity Assessment: Generalized weakness         Communication   Communication: HOH  Cognition Arousal/Alertness: Awake/alert Behavior During Therapy: WFL for tasks assessed/performed Overall Cognitive Status: No family/caregiver present to determine baseline cognitive functioning                 General Comments: Pt with dementia    General Comments      Exercises        Assessment/Plan    PT Assessment Patient needs continued PT services  PT Diagnosis Difficulty walking;Generalized weakness   PT Problem List Decreased strength;Decreased activity  tolerance;Decreased balance;Decreased mobility;Decreased knowledge of use of DME  PT Treatment Interventions DME instruction;Gait training;Functional mobility training;Therapeutic activities;Therapeutic exercise;Balance training;Patient/family education   PT Goals (Current goals can be found in the Care Plan section) Acute Rehab PT Goals Patient Stated Goal: Pt unable to state PT Goal Formulation: Patient unable to participate in goal setting Time For Goal Achievement: 09/27/15 Potential to Achieve Goals: Fair    Frequency Min 2X/week   Barriers to discharge         Co-evaluation               End of Session Equipment Utilized During Treatment: Gait belt Activity Tolerance: Patient limited by fatigue Patient left: in bed;with call bell/phone within reach;with bed alarm set;with nursing/sitter in room Nurse Communication: Mobility status    Functional Assessment Tool Used: clinical judement Functional Limitation: Mobility: Walking and moving around Mobility: Walking and Moving Around Current Status [phone]): At least 80 percent but less than 100 percent impaired, limited or restricted Mobility: Walking and Moving Around Goal Status [phone]): At least 40 percent but less than 60 percent impaired, limited or restricted    Time: 1308-6578 PT Time Calculation (min) (ACUTE ONLY): 24 min   Charges:   PT Evaluation $Initial PT Evaluation Tier I: 1 Procedure PT Treatments $Therapeutic Activity: 8-22 mins   PT G Codes:   PT G-Codes **NOT FOR INPATIENT CLASS** Functional Assessment Tool Used: clinical judement Functional Limitation: Mobility: Walking and moving around Mobility: Walking and Moving Around Current Status (I6962): At least 80 percent but less than 100 percent impaired, limited or restricted Mobility: Walking and Moving Around Goal Status [phone]): At least 40 percent but less than 60 percent impaired, limited or restricted    [facility] 09/13/2015, 11:45 AM [facility] PT [phone]

## 2015-09-13 NOTE — Progress Notes (Signed)
Initial Nutrition Assessment  DOCUMENTATION CODES:   Obesity unspecified  INTERVENTION:   -Continue MVI -Ensure Enlive po BID, each supplement provides 350 kcal and 20 grams of protein  NUTRITION DIAGNOSIS:   Increased nutrient needs related to wound healing as evidenced by estimated needs.  GOAL:   Patient will meet greater than or equal to 90% of their needs  MONITOR:   PO intake, Supplement acceptance, Diet advancement, Labs, Weight trends, Skin, I & O's  REASON FOR ASSESSMENT:   Malnutrition Screening Tool    ASSESSMENT:   [name] is a 81 y.o. 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 Patient presents to the ED with c/o generalized weakness, malaise, and progressive inability to ambulate for the past 4 days. Her daughter who has RA with deformities to fingers that are obvious to me on meeting her in the room, states that she cannot take care of the patient like this. Patient does have dementia and had been declining somewhat over the past 1 year but dramatically declined over the past 4 days.  Pt admitted with RLE cellulitis.   No family present at time of visit. Pt participated in conversation with RD at time of visit, however, hx likely unreliable due to hx of dementia. She reports poor appetite and weight loss, however, is unable to provdie further details.  Pt had a half consumed can of gingerale bedside; she denies any chewing or swallowing difficulties.   Reviewed wt hx; last recorded wt was on 06/03/2010 (168#). It is difficult to assess weight trends due to lack of available data.   Nutrition-Focused physical exam completed. Findings are mild fat depletion, no muscle depletion, and moderate edema.   CSW consulted for SNF placement.  Diet Order:  Diet Heart Room service appropriate?: Yes; Fluid consistency:: Thin  Skin:  Wound (see comment) (pressure ulcer on buttocks (stage unknown))  Last BM:   09/13/15  Height:   Ht Readings from Last 1 Encounters:  09/13/15 5' (1.524 m)    Weight:   Wt Readings from Last 1 Encounters:  09/13/15 174 lb 2.6 oz (79 kg)    Ideal Body Weight:  45.5 kg  BMI:  Body mass index is 34.01 kg/(m^2).  Estimated Nutritional Needs:   Kcal:  1500-1700  Protein:  60-75 grams  Fluid:  1.5-1.7 L  EDUCATION NEEDS:   Education needs addressed  Leshea Jaggers A. Mayford Knife, RD, LDN, CDE Pager: [phone] After hours Pager: [phone]

## 2015-09-14 DIAGNOSIS — I10 Essential (primary) hypertension: Secondary | ICD-10-CM | POA: Diagnosis present

## 2015-09-14 DIAGNOSIS — M353 Polymyalgia rheumatica: Secondary | ICD-10-CM | POA: Diagnosis present

## 2015-09-14 DIAGNOSIS — Z96651 Presence of right artificial knee joint: Secondary | ICD-10-CM | POA: Diagnosis present

## 2015-09-14 DIAGNOSIS — E860 Dehydration: Secondary | ICD-10-CM | POA: Diagnosis present

## 2015-09-14 DIAGNOSIS — Z792 Long term (current) use of antibiotics: Secondary | ICD-10-CM | POA: Diagnosis not present

## 2015-09-14 DIAGNOSIS — M8580 Other specified disorders of bone density and structure, unspecified site: Secondary | ICD-10-CM | POA: Diagnosis present

## 2015-09-14 DIAGNOSIS — F419 Anxiety disorder, unspecified: Secondary | ICD-10-CM | POA: Diagnosis present

## 2015-09-14 DIAGNOSIS — Z8619 Personal history of other infectious and parasitic diseases: Secondary | ICD-10-CM | POA: Diagnosis not present

## 2015-09-14 DIAGNOSIS — Z8614 Personal history of Methicillin resistant Staphylococcus aureus infection: Secondary | ICD-10-CM | POA: Diagnosis not present

## 2015-09-14 DIAGNOSIS — T8459XD Infection and inflammatory reaction due to other internal joint prosthesis, subsequent encounter: Secondary | ICD-10-CM | POA: Diagnosis not present

## 2015-09-14 DIAGNOSIS — R531 Weakness: Secondary | ICD-10-CM | POA: Diagnosis present

## 2015-09-14 DIAGNOSIS — I878 Other specified disorders of veins: Secondary | ICD-10-CM | POA: Diagnosis present

## 2015-09-14 DIAGNOSIS — I739 Peripheral vascular disease, unspecified: Secondary | ICD-10-CM | POA: Diagnosis present

## 2015-09-14 DIAGNOSIS — K219 Gastro-esophageal reflux disease without esophagitis: Secondary | ICD-10-CM | POA: Diagnosis present

## 2015-09-14 DIAGNOSIS — F329 Major depressive disorder, single episode, unspecified: Secondary | ICD-10-CM | POA: Diagnosis present

## 2015-09-14 DIAGNOSIS — E785 Hyperlipidemia, unspecified: Secondary | ICD-10-CM | POA: Diagnosis present

## 2015-09-14 DIAGNOSIS — F0390 Unspecified dementia without behavioral disturbance: Secondary | ICD-10-CM | POA: Diagnosis present

## 2015-09-14 DIAGNOSIS — L03115 Cellulitis of right lower limb: Secondary | ICD-10-CM | POA: Diagnosis present

## 2015-09-14 LAB — BASIC METABOLIC PANEL
ANION GAP: 9 (ref 5–15)
BUN: 11 mg/dL (ref 6–20)
CO2: 27 mmol/L (ref 22–32)
Calcium: 9.1 mg/dL (ref 8.9–10.3)
Chloride: 99 mmol/L — ABNORMAL LOW (ref 101–111)
Creatinine, Ser: 0.98 mg/dL (ref 0.44–1.00)
GFR, EST AFRICAN AMERICAN: 59 mL/min — AB (ref >60)
GFR, EST NON AFRICAN AMERICAN: 51 mL/min — AB (ref >60)
Glucose, Bld: 96 mg/dL (ref 65–99)
POTASSIUM: 3.9 mmol/L (ref 3.5–5.1)
SODIUM: 135 mmol/L (ref 135–145)

## 2015-09-14 LAB — CBC
HEMATOCRIT: 38.5 % (ref 36.0–46.0)
Hemoglobin: 12.8 g/dL (ref 12.0–15.0)
MCH: 30 pg (ref 26.0–34.0)
MCHC: 33.2 g/dL (ref 30.0–36.0)
MCV: 90.4 fL (ref 78.0–100.0)
Platelets: 169 10*3/uL (ref 150–400)
RBC: 4.26 MIL/uL (ref 3.87–5.11)
RDW: 12.2 % (ref 11.5–15.5)
WBC: 4.3 10*3/uL (ref 4.0–10.5)

## 2015-09-14 NOTE — Clinical Social Work Placement (Signed)
   CLINICAL SOCIAL WORK PLACEMENT  NOTE  Date:  09/14/2015  Patient Details  Name: [name] MRN: [id number] Date of Birth: [birth year]  Clinical Social Work is seeking post-discharge placement for this patient at the Skilled  Nursing Facility level of care (*CSW will initial, date and re-position this form in  chart as items are completed):  Yes   Patient/family provided with [facility] Clinical Social Work Department's list of facilities offering this level of care within the geographic area requested by the patient (or if unable, by the patient's family).  Yes   Patient/family informed of their freedom to choose among providers that offer the needed level of care, that participate in Medicare, Medicaid or managed care program needed by the patient, have an available bed and are willing to accept the patient.  Yes   Patient/family informed of [facility]'s ownership interest in [facility] and [facility], as well as of the fact that they are under no obligation to receive care at these facilities.  PASRR submitted to EDS on 09/14/15     PASRR number received on 09/14/15     Existing PASRR number confirmed on       FL2 transmitted to all facilities in geographic area requested by pt/family on 09/14/15     FL2 transmitted to all facilities within larger geographic area on       Patient informed that his/her managed care company has contracts with or will negotiate with certain facilities, including the following:            Patient/family informed of bed offers received.  Patient chooses bed at       Physician recommends and patient chooses bed at      Patient to be transferred to   on  .  Patient to be transferred to facility by       Patient family notified on   of transfer.  Name of family member notified:        PHYSICIAN       Additional Comment:   MD has signed FL-2. _______________________________________________ Vaughan Browner,  [name] 09/14/2015, 5:18 PM

## 2015-09-14 NOTE — Clinical Social Work Note (Signed)
Clinical Social Worker assessed patient and pt's family. Full psychosocial assessment to follow.   Derenda Fennel, MSW, LCSWA [phone] 09/14/2015 2:44 PM

## 2015-09-14 NOTE — Progress Notes (Signed)
Occupational Therapy Evaluation Patient Details Name: [name] MRN: [id number] DOB: [birth year] Today's Date: 09/14/2015    History of Present Illness Pt adm with cellulitis of RLE. PMH - chronic infection of rt TKA, dementia, HTN, RA   Clinical Impression   Patient presents to OT requiring nearly total assistance with all ADLs and mobility. She will benefit from skilled OT to maximize independence, decrease caregiver burden, and to facilitate a safe discharge plan. OT will follow.    Follow Up Recommendations  SNF;Supervision/Assistance - 24 hour    Equipment Recommendations  Other (comment) (to be determined at next venue of care)    Recommendations for Other Services PT consult     Precautions / Restrictions Precautions Precautions: Fall Restrictions Weight Bearing Restrictions: No      Mobility Bed Mobility Overal bed mobility: Needs Assistance Bed Mobility: Rolling Rolling: Max assist;+2 for physical assistance            Transfers                      Balance                                            ADL Overall ADL's : Needs assistance/impaired Eating/Feeding: Maximal assistance;Total assistance Eating/Feeding Details (indicate cue type and reason): Children/caregiver usually feed her. She was able to take sip of Ensure and retrieve/replace container from tray table with increased effort. Grooming: Total assistance   Upper Body Bathing: Total assistance   Lower Body Bathing: Total assistance;+2 for physical assistance   Upper Body Dressing : Total assistance   Lower Body Dressing: Total assistance;+2 for physical assistance     Toilet Transfer Details (indicate cue type and reason): max A +2 to roll, dependent to place bedpan Toileting- Clothing Manipulation and Hygiene: Total assistance       Functional mobility during ADLs: +2 for physical assistance;Maximal assistance (roll L/R in bed) General ADL  Comments: Patient's daughter Waynetta Sandy present and provided history. Beth called other sister, who is primary caregiver, and information also obtained from her. Erskine Squibb has RA and cannot safely care for patient at home, despite having some caregiver assistance 1pm-8pm. They would like for patient to have SNF rehab with the hope for her to eventually return home once she is moving better.      Vision     Perception     Praxis      Pertinent Vitals/Pain Pain Assessment: 0-10 Pain Score: 6  Pain Location: arms and shoulders Pain Descriptors / Indicators: Aching;Sore Pain Intervention(s): Limited activity within patient's tolerance;Monitored during session;Repositioned     Hand Dominance Right   Extremity/Trunk Assessment Upper Extremity Assessment Upper Extremity Assessment: RUE deficits/detail;LUE deficits/detail RUE Deficits / Details: PROM shoulder to 70 degrees, not able to maintain that position. Otherwise generalized weakness, weak grips. LUE Deficits / Details: PROM shoulder to 70 degrees, not able to maintain that position. Otherwise generalized weakness, weak grips.   Lower Extremity Assessment Lower Extremity Assessment: Defer to PT evaluation       Communication Communication Communication: HOH   Cognition Arousal/Alertness: Awake/alert Behavior During Therapy: WFL for tasks assessed/performed Overall Cognitive Status: History of cognitive impairments - at baseline                 General Comments: Pt with dementia   General Comments  Exercises       Shoulder Instructions      Home Living Family/patient expects to be discharged to:: Skilled nursing facility Living Arrangements: Children                               Additional Comments: Lives with daughter, who assists in her care. Caregiver is 1pm-4pm. Friend assists 4pm-8pm. Dtr provides all care between 8pm to 1pm the next day when caregiver arrives.      Prior  Functioning/Environment Level of Independence: Needs assistance  Gait / Transfers Assistance Needed: Per chart pt has been unable to amb for the last several days. ADL's / Homemaking Assistance Needed: at baseline patient requires assistance with sponge bathing, getting dressed, toileting, feeding, grooming. Caregiver assists her lift chair=>transport chair=>bathroom   Comments: has RW, hospital bed, BSC, transport chair, lift chair    OT Diagnosis: Generalized weakness;Acute pain;Cognitive deficits   OT Problem List: Decreased strength;Decreased range of motion;Decreased activity tolerance;Decreased cognition;Decreased knowledge of use of DME or AE;Pain   OT Treatment/Interventions: Self-care/ADL training;DME and/or AE instruction;Therapeutic activities;Patient/family education    OT Goals(Current goals can be found in the care plan section) Acute Rehab OT Goals OT Goal Formulation: With patient Time For Goal Achievement: 09/28/15 Potential to Achieve Goals: Fair  OT Frequency: Min 2X/week   Barriers to D/C: Decreased caregiver support  daughter has RA and has difficulty managing patient at home       Co-evaluation              End of Session Nurse Communication: Mobility status  Activity Tolerance: Patient limited by pain;Patient limited by lethargy Patient left: in bed;with call bell/phone within reach;with bed alarm set;with family/visitor present   Time: 1610-9604 OT Time Calculation (min): 40 min Charges:  OT General Charges $OT Visit: 1 Procedure OT Evaluation $Initial OT Evaluation Tier I: 1 Procedure OT Treatments $Self Care/Home Management : 8-22 mins $Therapeutic Activity: 8-22 mins G-Codes: OT G-codes **NOT FOR INPATIENT CLASS** Functional Assessment Tool Used: clinical judgment Functional Limitation: Self care Self Care Current Status (V4098): At least 80 percent but less than 100 percent impaired, limited or restricted Self Care Goal Status (J1914): At  least 60 percent but less than 80 percent impaired, limited or restricted  Autumm Hattery A 09/14/2015, 11:51 AM

## 2015-09-14 NOTE — Clinical Social Work Note (Signed)
Clinical Social Work Assessment  Patient Details  Name: [name] MRN: [id number] Date of Birth: 08/15/1927  Date of referral:  09/14/15               Reason for consult:  Facility Placement, Discharge Planning                Permission sought to share information with:  Family Supports, Case Production designer, theatre/television/film, Oceanographer granted to share information::  Yes, Verbal Permission Granted  Name::      [name] )  Agency::   (SNF's )  Relationship::   (Daughter)  Contact Information:   [phone])  Housing/Transportation Living arrangements for the past 2 months:  Single Family Home Source of Information:  Adult Children Patient Interpreter Needed:  None Criminal Activity/Legal Involvement Pertinent to Current Situation/Hospitalization:  No - Comment as needed Significant Relationships:  Adult Children Lives with:  Self Do you feel safe going back to the place where you live?  No Need for family participation in patient care:  No (Coment)  Care giving concerns:  Requiring continued therapy and care at skilled level.   Social Worker assessment / plan:  CSW spoke with pt's dtr, [name] in reference to post-acute placement for SNF. CSW introduced CSW role and SNF process. Pt's dtr prefers [facility]. No further concerns reported by family at this time. CSW has completed FL-2 and faxed clinicals to SNF's. Patient has 30 DAY PASARR. CSW will continue to follow pt and family for continued support and to facilitate pt's discharge needs once medically stable.  Employment status:  Retired Database administrator PT Recommendations:  Skilled Nursing Facility Information / Referral to community resources:  Skilled Nursing Facility  Patient/Family's Response to care:  Pt disoriented. Pt's family agreeable to SNF placement and prefers [facility]. Pt's dtr pleasant and appreciated social work intervention.  Patient/Family's  Understanding of and Emotional Response to Diagnosis, Current Treatment, and Prognosis:  Pt's family knowledgeable of medical work-up.  Emotional Assessment Appearance:  Appears stated age Attitude/Demeanor/Rapport:  Unable to Assess Affect (typically observed):  Unable to Assess Orientation:  Oriented to Self Alcohol / Substance use:  Not Applicable Psych involvement (Current and /or in the community):  No (Comment)  Discharge Needs  Concerns to be addressed:  Care Coordination Readmission within the last 30 days:  No Current discharge risk:  Dependent with Mobility, Cognitively Impaired Barriers to Discharge:  Continued Medical Work up, [place] [employer], MSW, LCSWA [phone] 09/14/2015 5:18 PM

## 2015-09-14 NOTE — Care Management Note (Signed)
Case Management Note  Patient Details  Name: [name] MRN: [id number] Date of Birth: 1927/05/31  Subjective/Objective:     Patient for possible dc over the weekend.               Action/Plan:   Expected Discharge Date:                  Expected Discharge Plan:  Skilled Nursing Facility  In-House Referral:  Clinical Social Work  Discharge planning Services  CM Consult  Post Acute Care Choice:    Choice offered to:     DME Arranged:    DME Agency:     HH Arranged:    HH Agency:     Status of Service:  Completed, signed off  Medicare Important Message Given:    Date Medicare IM Given:    Medicare IM give by:    Date Additional Medicare IM Given:    Additional Medicare Important Message give by:     If discussed at Long Length of Stay Meetings, dates discussed:    Additional Comments:  Leone Haven, RN 09/14/2015, 4:14 PM

## 2015-09-14 NOTE — Progress Notes (Signed)
[facility] PROGRESS NOTE  [name]:[id number] DOB: [birth year] DOA: 09/12/2015 PCP: Martha Clan, MD  Assessment/Plan: #1 right lower extremity cellulitis on chronic venous stasis changes Clinical improvement. Patient is afebrile. WBC is normalized. Patient has been seen in consultation by orthopedics and did feel patient's knee septic. Continue empiric IV vancomycin and will transition to oral doxycycline on discharge.  #2 hypertension Stable. Continue Norvasc and ARB and HCTZ.  #3 dehydration Gentle hydration.  #4 prophylaxis Heparin for DVT prophylaxis.  Code Status: Full Family Communication: Updated patient. Updated daughter via telephone. Disposition Plan: Needs SNF on discharge.   Consultants:  Orthopedics: Dr. Turner Daniels 09/13/2015  Procedures:  Chest x-ray 09/12/2015  Antibiotics:  IV vancomycin 09/13/2015  HPI/Subjective: Patient denies shortness of breath. No chest pain.  Objective: Filed Vitals:   09/14/15 0807  BP:   Pulse:   Temp: 98.4 F (36.9 C)  Resp: 18    Intake/Output Summary (Last 24 hours) at 09/14/15 1327 Last data filed at 09/14/15 0923  Gross per 24 hour  Intake    240 ml  Output      0 ml  Net    240 ml   Filed Weights   09/13/15 0123  Weight: 79 kg (174 lb 2.6 oz)    Exam:   General:  NAD  Cardiovascular: RRR  Respiratory: CTAB  Abdomen: Soft, nontender, nondistended, positive bowel sounds.  Musculoskeletal: No clubbing cyanosis or edema. Right lower extremity with chronic venous stasis changes, some warmth, decreasing erythema.  Data Reviewed: Basic Metabolic Panel:  Recent Labs Lab 09/12/15 2100 09/12/15 2107 09/14/15 0457  NA 135 135 135  K 3.9 3.8 3.9  CL 97* 95* 99*  CO2 26  --  27  GLUCOSE 122* 121* 96  BUN 19 23* 11  CREATININE 1.20* 1.30* 0.98  CALCIUM 10.4*  --  9.1   Liver Function Tests: No results for input(s): AST, ALT, ALKPHOS, BILITOT, PROT, ALBUMIN in the last 168  hours. No results for input(s): LIPASE, AMYLASE in the last 168 hours. No results for input(s): AMMONIA in the last 168 hours. CBC:  Recent Labs Lab 09/12/15 2100 09/12/15 2107 09/14/15 0457  WBC 6.8  --  4.3  HGB 14.2 15.6* 12.8  HCT 41.7 46.0 38.5  MCV 90.5  --  90.4  PLT 215  --  169   Cardiac Enzymes: No results for input(s): CKTOTAL, CKMB, CKMBINDEX, TROPONINI in the last 168 hours. BNP (last 3 results)  Recent Labs  09/12/15 2100  BNP 44.3    ProBNP (last 3 results) No results for input(s): PROBNP in the last 8760 hours.  CBG: No results for input(s): GLUCAP in the last 168 hours.  Recent Results (from the past 240 hour(s))  Culture, blood (routine x 2)     Status: None (Preliminary result)   Collection Time: 09/13/15  1:49 AM  Result Value Ref Range Status   Specimen Description BLOOD BLOOD RIGHT FOREARM  Final   Special Requests BOTTLES DRAWN AEROBIC AND ANAEROBIC 10CC  Final   Culture NO GROWTH 1 DAY  Final   Report Status PENDING  Incomplete  Culture, blood (routine x 2)     Status: None (Preliminary result)   Collection Time: 09/13/15  2:01 AM  Result Value Ref Range Status   Specimen Description BLOOD LEFT HAND  Final   Special Requests BOTTLES DRAWN AEROBIC AND ANAEROBIC 10CC  Final   Culture NO GROWTH 1 DAY  Final   Report Status PENDING  Incomplete     Studies: Dg Chest 2 View  09/12/2015   CLINICAL DATA:  Shortness of breath for several days.  Hypertension.  EXAM: CHEST  2 VIEW  COMPARISON:  July 03, 2006  FINDINGS: There is mild elevation of the left hemidiaphragm. There is atelectatic change in the left base. The lungs are otherwise clear. Heart size and pulmonary vascularity are normal. There is atherosclerotic change in the aorta. No adenopathy. 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 There are chronic rotator cuff tears bilaterally. There is thoracolumbar levoscoliosis.  IMPRESSION:  Atelectasis left base. No edema or consolidation. Heart size within normal limits. Extensive arterial vascular calcification. Avascular necrosis left humeral head. Extensive arthropathy in each shoulder with chronic rotator cuff tears bilaterally.   Electronically Signed   By: Bretta Bang III M.D.   On: 09/12/2015 21:38    Scheduled Meds: . ALPRAZolam  0.5 mg Oral BID  . amLODipine  5 mg Oral Daily  . aspirin  325 mg Oral Daily  . feeding supplement (ENSURE ENLIVE)  237 mL Oral BID BM  . heparin  5,000 Units Subcutaneous 3 times per day  . losartan  100 mg Oral Daily   And  . hydrochlorothiazide  25 mg Oral Daily  . multivitamin with minerals  1 tablet Oral Daily  . pantoprazole  40 mg Oral Daily  . traMADol  50 mg Oral BID  . traZODone  100 mg Oral QHS  . vancomycin  1,000 mg Intravenous Q24H   Continuous Infusions: . 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 If 7PM-7AM, please contact night-coverage at [email or web address], password [facility] 09/14/2015, 1:27 PM  LOS: 0 days

## 2015-09-14 NOTE — Progress Notes (Signed)
ANTIBIOTIC CONSULT NOTE - FOLLOW UP  Pharmacy Consult for vancomycin Indication: cellulitis   No Known Allergies  Patient Measurements: Height: 5' (152.4 cm) Weight: 174 lb 2.6 oz (79 kg) IBW/kg (Calculated) : 45.5  Vital Signs: Temp: 98.4 F (36.9 C) (10/07 0807) Temp Source: Oral (10/07 0807) BP: 129/97 mmHg (10/07 0610) Pulse Rate: 80 (10/07 0610)  Labs:  Recent Labs  09/12/15 2100 09/12/15 2107 09/14/15 0457  WBC 6.8  --  4.3  HGB 14.2 15.6* 12.8  PLT 215  --  169  CREATININE 1.20* 1.30* 0.98  Estimated CrCl: 35-45 ml/min  Microbiology: 10/6: blood: ngtd  Abx: Vanc 10/6>>   Assessment: [name] with venous stasis on chronic doxy admitted with cellulitis  Goal of Therapy:  Vancomycin trough level 10-15 mcg/ml  Plan:  1. Continue vancomycin 1000 mg q 24H for now 2. If renal fxn stable and IV abx continued will obtain trough next week. 3. Following daily, notes prn.  Pollyann Samples, PharmD, BCPS 09/14/2015, 11:51 AM Pager: [phone]

## 2015-09-15 LAB — CBC
HCT: 33.4 % — ABNORMAL LOW (ref 36.0–46.0)
Hemoglobin: 11.4 g/dL — ABNORMAL LOW (ref 12.0–15.0)
MCH: 30.9 pg (ref 26.0–34.0)
MCHC: 34.1 g/dL (ref 30.0–36.0)
MCV: 90.5 fL (ref 78.0–100.0)
PLATELETS: 158 10*3/uL (ref 150–400)
RBC: 3.69 MIL/uL — ABNORMAL LOW (ref 3.87–5.11)
RDW: 12.2 % (ref 11.5–15.5)
WBC: 4.4 10*3/uL (ref 4.0–10.5)

## 2015-09-15 LAB — BASIC METABOLIC PANEL
Anion gap: 8 (ref 5–15)
BUN: 9 mg/dL (ref 6–20)
CALCIUM: 9.4 mg/dL (ref 8.9–10.3)
CO2: 28 mmol/L (ref 22–32)
CREATININE: 0.81 mg/dL (ref 0.44–1.00)
Chloride: 101 mmol/L (ref 101–111)
GFR calc Af Amer: >60 mL/min (ref >60)
Glucose, Bld: 86 mg/dL (ref 65–99)
Potassium: 3.9 mmol/L (ref 3.5–5.1)
SODIUM: 137 mmol/L (ref 135–145)

## 2015-09-15 MED ORDER — VANCOMYCIN HCL IN DEXTROSE 1-5 GM/200ML-% IV SOLN
1000.0000 mg | INTRAVENOUS | Status: AC
  Administered 2015-09-15: 1000 mg via INTRAVENOUS
  Filled 2015-09-15: qty 200

## 2015-09-15 MED ORDER — DOXYCYCLINE HYCLATE 100 MG PO TABS
100.0000 mg | ORAL_TABLET | Freq: Two times a day (BID) | ORAL | Status: DC
  Administered 2015-09-16: 100 mg via ORAL
  Filled 2015-09-15: qty 1

## 2015-09-15 NOTE — Progress Notes (Signed)
[facility] PROGRESS NOTE  [name]:[id number] DOB: 12/17/1926 DOA: 09/12/2015 PCP: Martha Clan, MD  Assessment/Plan: #1 right lower extremity cellulitis on chronic venous stasis changes Clinical improvement. Patient is afebrile. WBC is normalized. Patient has been seen in consultation by orthopedics and did feel patient's knee septic. Continue empiric IV vancomycin and will transition to oral doxycycline tomorrow.   #2 hypertension Stable. Continue Norvasc and ARB and HCTZ.  #3 dehydration Saline lock IV fluids.   #4 prophylaxis Heparin for DVT prophylaxis.  Code Status: Full Family Communication: Updated patient. Updated daughter via telephone. Disposition Plan: Hopefully to skilled nursing facility tomorrow.   Consultants:  Orthopedics: Dr. Turner Daniels 09/13/2015  Procedures:  Chest x-ray 09/12/2015  Antibiotics:  IV vancomycin 09/13/2015  HPI/Subjective: Patient states she is feeling well. No chest pain. No shortness of breath. Patient tolerated diet. Family at bedside.  Objective: Filed Vitals:   09/15/15 1356  BP: 138/57  Pulse: 90  Temp: 98 F (36.7 C)  Resp: 20    Intake/Output Summary (Last 24 hours) at 09/15/15 1605 Last data filed at 09/15/15 1300  Gross per 24 hour  Intake 5137.5 ml  Output      0 ml  Net 5137.5 ml   Filed Weights   09/13/15 0123  Weight: 79 kg (174 lb 2.6 oz)    Exam:   General:  NAD  Cardiovascular: RRR  Respiratory: CTAB anterior lung fields.  Abdomen: Soft, nontender, nondistended, positive bowel sounds.  Musculoskeletal: No clubbing cyanosis or edema. Right lower extremity with chronic venous stasis changes, some warmth, decreasing erythema.  Data Reviewed: Basic Metabolic Panel:  Recent Labs Lab 09/12/15 2100 09/12/15 2107 09/14/15 0457 09/15/15 0443  NA 135 135 135 137  K 3.9 3.8 3.9 3.9  CL 97* 95* 99* 101  CO2 26  --  27 28  GLUCOSE 122* 121* 96 86  BUN 19 23* 11 9  CREATININE  1.20* 1.30* 0.98 0.81  CALCIUM 10.4*  --  9.1 9.4   Liver Function Tests: No results for input(s): AST, ALT, ALKPHOS, BILITOT, PROT, ALBUMIN in the last 168 hours. No results for input(s): LIPASE, AMYLASE in the last 168 hours. No results for input(s): AMMONIA in the last 168 hours. CBC:  Recent Labs Lab 09/12/15 2100 09/12/15 2107 09/14/15 0457 09/15/15 0443  WBC 6.8  --  4.3 4.4  HGB 14.2 15.6* 12.8 11.4*  HCT 41.7 46.0 38.5 33.4*  MCV 90.5  --  90.4 90.5  PLT 215  --  169 158   Cardiac Enzymes: No results for input(s): CKTOTAL, CKMB, CKMBINDEX, TROPONINI in the last 168 hours. BNP (last 3 results)  Recent Labs  09/12/15 2100  BNP 44.3    ProBNP (last 3 results) No results for input(s): PROBNP in the last 8760 hours.  CBG: No results for input(s): GLUCAP in the last 168 hours.  Recent Results (from the past 240 hour(s))  Culture, blood (routine x 2)     Status: None (Preliminary result)   Collection Time: 09/13/15  1:49 AM  Result Value Ref Range Status   Specimen Description BLOOD BLOOD RIGHT FOREARM  Final   Special Requests BOTTLES DRAWN AEROBIC AND ANAEROBIC 10CC  Final   Culture NO GROWTH 2 DAYS  Final   Report Status PENDING  Incomplete  Culture, blood (routine x 2)     Status: None (Preliminary result)   Collection Time: 09/13/15  2:01 AM  Result Value Ref Range Status   Specimen Description BLOOD LEFT  HAND  Final   Special Requests BOTTLES DRAWN AEROBIC AND ANAEROBIC 10CC  Final   Culture NO GROWTH 2 DAYS  Final   Report Status PENDING  Incomplete     Studies: No results found.  Scheduled Meds: . ALPRAZolam  0.5 mg Oral BID  . amLODipine  5 mg Oral Daily  . aspirin  325 mg Oral Daily  . feeding supplement (ENSURE ENLIVE)  237 mL Oral BID BM  . heparin  5,000 Units Subcutaneous 3 times per day  . losartan  100 mg Oral Daily   And  . hydrochlorothiazide  25 mg Oral Daily  . multivitamin with minerals  1 tablet Oral Daily  . pantoprazole  40  mg Oral Daily  . traMADol  50 mg Oral BID  . traZODone  100 mg Oral QHS  . 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 If 7PM-7AM, please contact night-coverage at [email or web address], password [facility] 09/15/2015, 4:05 PM  LOS: 1 day

## 2015-09-16 DIAGNOSIS — T8459XD Infection and inflammatory reaction due to other internal joint prosthesis, subsequent encounter: Secondary | ICD-10-CM

## 2015-09-16 LAB — BASIC METABOLIC PANEL
ANION GAP: 7 (ref 5–15)
BUN: 10 mg/dL (ref 6–20)
CHLORIDE: 100 mmol/L — AB (ref 101–111)
CO2: 27 mmol/L (ref 22–32)
Calcium: 9.3 mg/dL (ref 8.9–10.3)
Creatinine, Ser: 0.87 mg/dL (ref 0.44–1.00)
GFR calc non Af Amer: 59 mL/min — ABNORMAL LOW (ref >60)
Glucose, Bld: 90 mg/dL (ref 65–99)
POTASSIUM: 3.7 mmol/L (ref 3.5–5.1)
SODIUM: 134 mmol/L — AB (ref 135–145)

## 2015-09-16 MED ORDER — ENOXAPARIN SODIUM 30 MG/0.3ML ~~LOC~~ SOLN: 30.0000 mg | SUBCUTANEOUS | Status: AC

## 2015-09-16 MED ORDER — ENSURE ENLIVE PO LIQD: 237.0000 mL | Freq: Two times a day (BID) | ORAL | Status: AC

## 2015-09-16 MED ORDER — DOXYCYCLINE HYCLATE 100 MG PO TABS: 100.0000 mg | ORAL_TABLET | Freq: Two times a day (BID) | ORAL | Status: AC

## 2015-09-16 MED ORDER — ALPRAZOLAM 0.5 MG PO TABS: 0.5000 mg | ORAL_TABLET | Freq: Two times a day (BID) | ORAL | Status: AC

## 2015-09-16 MED ORDER — DOXYCYCLINE HYCLATE 100 MG PO TBEC: 100.0000 mg | DELAYED_RELEASE_TABLET | Freq: Every day | ORAL | Status: AC

## 2015-09-16 MED ORDER — TRAMADOL HCL 50 MG PO TABS: 50.0000 mg | ORAL_TABLET | Freq: Two times a day (BID) | ORAL | Status: AC

## 2015-09-16 NOTE — Social Work (Signed)
Patient has a bed available at SNF for as soon as Sunday 09/16/15. Patient can be discharged once DC Summary complete.  Beverly Sessions MSW, [name] [phone]

## 2015-09-16 NOTE — Discharge Summary (Signed)
Physician Discharge Summary  [name] XLK:[id number] DOB: 1927/06/06 DOA: 09/12/2015  PCP: Martha Clan, MD  Admit date: 09/12/2015 Discharge date: 09/16/2015  Time spent: 65 minutes  Recommendations for Outpatient Follow-up:  1. Follow-up with M.D. at skilled nursing facility. Patient is being discharged on doxycycline 100 mg twice daily for 5 more days to complete a one-week course of therapy and then will need to be transitioned back to her suppressive dose of doxycycline 100 mg daily. 2. Follow-up with Dr. Turner Daniels of orthopedics in 2-3 weeks.  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 Patient presented to the ED with c/o generalized weakness, malaise, and progressive inability to ambulate for the past 4 days prior to admission. Her daughter who has RA with deformities to fingers that are obvious to me on meeting her in the room, stated that she cannot take care of the patient like this. Patient does have dementia and had been declining somewhat over the past 1 year but dramatically declined over the past 4 days prior to admission.  Regarding her SA infection of the R knee joint: she was kept on suppressive keflex from 2006 until the end of last year (2015) when she developed inflammation and pain in the R knee joint. Daughter stated that patients orthopaedic surgeon said that keflex wasn't working anymore  (bacteria had become resistant), and switched patient to doxycycline once a day. Knee inflammation went down shortly there after and she has been doing well until 4 days prior to admission.  Since then patient has also developed warmth, pain, and erythema of her R shin around an area that she has chronic venous stasis changes of (R knee is not inflamed specifically though).  Hospital Course:   #1 right lower extremity cellulitis on chronic venous stasis changes Patient had presented with warmth pain and erythema around her right shin felt to be secondary to an acute right lower extremity cellulitis with underlying chronic venous stasis changes. Due to patient's known history of MSSA that had been suspected to convert to MRSA in her right knee and due to the fact that she developed cellulitis while on chronic suppressive doxycycline therapy patient was placed empirically on IV vancomycin. Patient improved clinically. Patient remained afebrile. Patient improved clinically. Patient was seen in consultation by orthopedics and didnot feel patient's knee was septic. Patient was subsequently transitioned to oral doxycycline 100 mg twice daily and will remain on this regimen for 5 more days to complete a seven-day course of antibiotic therapy and subsequently be transitioned back to her suppressive dose of doxycycline 100 mg daily. Patient will follow-up with orthopedics as outpatient. Patient be discharged in stable and improved condition.   #2 hypertension On admission patient was noted to be dehydrated and a such a diabetic was held. Patient was maintained on her other antihypertensives medications. Patient diabetic was added to her regimen when she was euvolemic. Patient's blood pressure remained  stable. Outpatient follow-up.   #3 dehydration Patient was noted to be dehydrated on admission. Patient's antihypertensives medications well. Patient was hydrated with IV fluids and was euvolemic by day of  discharge.     Procedures:  Chest x-ray 09/12/2015  Consultations:  Orthopedics: Dr. Turner Daniels 09/13/2015    Discharge Exam: Filed Vitals:   09/16/15 0518  BP: 174/51  Pulse: 75  Temp: 98.4 F (36.9 C)  Resp: 16    General: NAD Cardiovascular: RRR Respiratory: CTAB Ext: RLE less erythemous, NTTP, improved warmth.  Discharge Instructions   Discharge Instructions    Diet general    Complete by:  As directed      Discharge instructions    Complete by:  As directed   Follow up with Dr Turner Daniels in 2-3 weeks.     Increase activity slowly    Complete by:  As directed           Current Discharge Medication List    START taking these medications   Details  doxycycline (VIBRA-TABS) 100 MG tablet Take 1 tablet (100 mg total) by mouth every 12 (twelve) hours. Take for 5 days then back to prophylactic dose to doxycycline  daily Qty: 10 tablet, Refills: 0    enoxaparin (LOVENOX) 30 MG/0.3ML injection Inject 0.3 mLs (30 mg total) into the skin daily.    feeding supplement, ENSURE ENLIVE, (ENSURE ENLIVE) LIQD Take 237 mLs by mouth 2 (two) times daily between meals. Qty: 237 mL, Refills: 12      CONTINUE these medications which have CHANGED   Details  ALPRAZolam (XANAX) 0.5 MG tablet Take 1 tablet (0.5 mg total) by mouth 2 (two) times daily. Qty: 30 tablet, Refills: 0    doxycycline (DORYX) 100 MG EC tablet Take 1 tablet (100 mg total) by mouth daily. Resume on 09/22/2015    traMADol (ULTRAM) 50 MG tablet Take 1 tablet (50 mg total) by mouth 2 (two) times daily. Qty: 30 tablet, Refills: 0      CONTINUE these medications which have NOT CHANGED   Details  alendronate (FOSAMAX) 70 MG tablet Take 70 mg by mouth every Sunday. Take with a full glass of water on an empty stomach.    amLODipine (NORVASC) 5 MG tablet Take 5 mg by mouth daily.    aspirin 325 MG EC tablet Take 325 mg by mouth daily.    chlorpheniramine (CHLORPHEN) 4 MG tablet Take 8 mg by mouth 2 (two)  times daily.     Dextromethorphan-Guaifenesin (MUCUS RELIEF DM COUGH PO) Take 1 tablet by mouth at bedtime.     lansoprazole (PREVACID) 30 MG capsule Take 30 mg by mouth daily at 12 noon.    losartan-hydrochlorothiazide (HYZAAR) 100-25 MG tablet Take 1 tablet by mouth daily.    Multiple Vitamin (MULTIVITAMIN WITH MINERALS) TABS tablet Take 1 tablet by mouth daily.    traZODone (DESYREL) 100 MG tablet Take 100 mg by mouth at bedtime.       No Known Allergies Follow-up Information    Follow up with Nestor Lewandowsky, MD. Schedule an appointment as soon as possible for a visit in 3 weeks.   Specialty:  Orthopedic Surgery   Why:  f/u in 2-3 weeks   Contact information:   [address] [place] [address] [phone]       Please follow up.   Why:  f/u with MD at SNF       The results of significant diagnostics from this hospitalization (including imaging, microbiology, ancillary and  laboratory) are listed below for reference.    Significant Diagnostic Studies: Dg Chest 2 View  09/12/2015   CLINICAL DATA:  Shortness of breath for several days.  Hypertension.  EXAM: CHEST  2 VIEW  COMPARISON:  July 03, 2006  FINDINGS: There is mild elevation of the left hemidiaphragm. There is atelectatic change in the left base. The lungs are otherwise clear. Heart size and pulmonary vascularity are normal. There is atherosclerotic change in the aorta. No adenopathy. 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 There are chronic rotator cuff tears bilaterally. There is thoracolumbar levoscoliosis.  IMPRESSION: Atelectasis left base. No edema or consolidation. Heart size within normal limits. Extensive arterial vascular calcification. Avascular necrosis left humeral head. Extensive arthropathy in each shoulder with chronic rotator cuff tears bilaterally.   Electronically Signed   By: Bretta Bang III M.D.   On: 09/12/2015 21:38     Microbiology: Recent Results (from the past 240 hour(s))  Culture, blood (routine x 2)     Status: None (Preliminary result)   Collection Time: 09/13/15  1:49 AM  Result Value Ref Range Status   Specimen Description BLOOD BLOOD RIGHT FOREARM  Final   Special Requests BOTTLES DRAWN AEROBIC AND ANAEROBIC 10CC  Final   Culture NO GROWTH 3 DAYS  Final   Report Status PENDING  Incomplete  Culture, blood (routine x 2)     Status: None (Preliminary result)   Collection Time: 09/13/15  2:01 AM  Result Value Ref Range Status   Specimen Description BLOOD LEFT HAND  Final   Special Requests BOTTLES DRAWN AEROBIC AND ANAEROBIC 10CC  Final   Culture NO GROWTH 3 DAYS  Final   Report Status PENDING  Incomplete     Labs: Basic Metabolic Panel:  Recent Labs Lab 09/12/15 2100 09/12/15 2107 09/14/15 0457 09/15/15 0443 09/16/15 0545  NA 135 135 135 137 134*  K 3.9 3.8 3.9 3.9 3.7  CL 97* 95* 99* 101 100*  CO2 26  --  27 28 27   GLUCOSE 122* 121* 96 86 90  BUN 19 23* 11 9 10   CREATININE 1.20* 1.30* 0.98 0.81 0.87  CALCIUM 10.4*  --  9.1 9.4 9.3   Liver Function Tests: No results for input(s): AST, ALT, ALKPHOS, BILITOT, PROT, ALBUMIN in the last 168 hours. No results for input(s): LIPASE, AMYLASE in the last 168 hours. No results for input(s): AMMONIA in the last 168 hours. CBC:  Recent Labs Lab 09/12/15 2100 09/12/15 2107 09/14/15 0457 09/15/15 0443  WBC 6.8  --  4.3 4.4  HGB 14.2 15.6* 12.8 11.4*  HCT 41.7 46.0 38.5 33.4*  MCV 90.5  --  90.4 90.5  PLT 215  --  169 158   Cardiac Enzymes: No results for input(s): CKTOTAL, CKMB, CKMBINDEX, TROPONINI in the last 168 hours. BNP: BNP (last 3 results)  Recent Labs  09/12/15 2100  BNP 44.3    ProBNP (last 3 results) No results for input(s): PROBNP in the last 8760 hours.  CBG: No results for input(s): GLUCAP in the last 168 hours.     SignedRamiro Harvest MD [facility] 09/16/2015, 2:02 PM

## 2015-09-16 NOTE — Progress Notes (Signed)
Pt prepared for d/c to SNF. IV d/c'd. Skin intact except as charted in most recent assessments. Vitals are stable. Report called to receiving facility. Pt to be transported by ambulance service. 

## 2015-09-16 NOTE — Progress Notes (Signed)
Nsg Discharge Note  Admit Date:  09/12/2015 Discharge date: 09/16/2015   Juanito Doom to be D/C'd Skilled nursing facility  per MD order. Report called in earlier from [place], [place].  AVS completed.  Copy for chart, and copy for patient signed, and dated. Patient/caregiver able to verbalize understanding.  Discharge Medication:   Medication List    TAKE these medications        alendronate 70 MG tablet  Commonly known as:  FOSAMAX  Take 70 mg by mouth every Sunday. Take with a full glass of water on an empty stomach.     ALPRAZolam 0.5 MG tablet  Commonly known as:  XANAX  Take 1 tablet (0.5 mg total) by mouth 2 (two) times daily.     amLODipine 5 MG tablet  Commonly known as:  NORVASC  Take 5 mg by mouth daily.     aspirin 325 MG EC tablet  Take 325 mg by mouth daily.     CHLORPHEN 4 MG tablet  Generic drug:  chlorpheniramine  Take 8 mg by mouth 2 (two) times daily.     doxycycline 100 MG tablet  Commonly known as:  VIBRA-TABS  Take 1 tablet (100 mg total) by mouth every 12 (twelve) hours. Take for 5 days then back to prophylactic dose to doxycycline  daily     doxycycline 100 MG EC tablet  Commonly known as:  DORYX  Take 1 tablet (100 mg total) by mouth daily. Resume on 09/22/2015  Start taking on:  09/22/2015     enoxaparin 30 MG/0.3ML injection  Commonly known as:  LOVENOX  Inject 0.3 mLs (30 mg total) into the skin daily.     feeding supplement (ENSURE ENLIVE) Liqd  Take 237 mLs by mouth 2 (two) times daily between meals.     lansoprazole 30 MG capsule  Commonly known as:  PREVACID  Take 30 mg by mouth daily at 12 noon.     losartan-hydrochlorothiazide 100-25 MG tablet  Commonly known as:  HYZAAR  Take 1 tablet by mouth daily.     MUCUS RELIEF DM COUGH PO  Take 1 tablet by mouth at bedtime.     multivitamin with minerals Tabs tablet  Take 1 tablet by mouth daily.     traMADol 50 MG tablet  Commonly known as:  ULTRAM  Take 1 tablet (50 mg total)  by mouth 2 (two) times daily.     traZODone 100 MG tablet  Commonly known as:  DESYREL  Take 100 mg by mouth at bedtime.        Discharge Assessment: Filed Vitals:   09/16/15 1404  BP: 129/48  Pulse: 92  Temp: 98.3 F (36.8 C)  Resp: 20   Skin clean, dry and intact without evidence of skin break down, no evidence of skin tears noted. IV catheter discontinued intact. Site without signs and symptoms of complications - no redness or edema noted at insertion site, patient denies c/o pain - only slight tenderness at site.  Dressing with slight pressure applied.  D/c Instructions-Education: Discharge instructions given to patient/family with verbalized understanding. D/c education completed with patient/family including follow up instructions, medication list, d/c activities limitations if indicated, with other d/c instructions as indicated by MD - patient able to verbalize understanding, all questions fully answered. Patient instructed to return to ED, call 911, or call MD for any changes in condition.  Patient escorted via WC, and D/C home via private auto.  Kern Reap, RN 09/16/2015 6:11  PM

## 2015-09-17 ENCOUNTER — Other Ambulatory Visit: Payer: Self-pay

## 2015-09-17 DIAGNOSIS — R531 Weakness: Secondary | ICD-10-CM

## 2015-09-17 NOTE — Patient Outreach (Signed)
Telephone call: Patient is not active at this time but daughter request patient to be opened to Child psychotherapist. Phone call from daughter about mother, [name]. Wille Celeste reports her mother is in rehab at [facility] an rehab center. Daughter is  interested in speaking with social worker about help with current options and future plans for level of care. Daughter reports that she is not able to take care of her mother. Reports that she has worked with [facility] social worker in the past and needs assistance again.  PLAN: Will place order for [facility] social worker.  Patient can be reopened to community if patient returns home.   Rowe Pavy, RN, BSN, CEN [facility] [phone]

## 2015-09-18 ENCOUNTER — Other Ambulatory Visit: Payer: Self-pay | Admitting: *Deleted

## 2015-09-18 LAB — CULTURE, BLOOD (ROUTINE X 2)
Culture: NO GROWTH
Culture: NO GROWTH

## 2015-09-18 NOTE — Patient Outreach (Signed)
[facility]) Care Management  09/18/2015  KALY MCQUARY 03-Apr-1927 [id number]   Phone call to patient's daughter, [name] to assess for social work needs.  Per patient's daughter, patient is now at [facility] skilled nursing facility. Ms. Gerre Couch has scheduled a family meeting today to discuss patient's treatment thus far.  Patient's daughter is not sure at this time if patient will be able to come home, however discussed that she would like patient to come home even if she has to hire additional help.  Plan:  Ms. Gerre Couch to call this social worker on 09/19/15 to discuss results of  today's meeting and potential treatment plan.    Adriana Reams [facility] Care Management [phone]

## 2015-09-18 NOTE — Patient Outreach (Signed)
[facility]) Care Management  09/18/2015  [name] 02/14/1888 [id number]   Self referral to Rowe Pavy, RN by patients daughter.  Request from Rowe Pavy, RN to assign SW, assigned [employer], [address].  Thanks, [name]. [name] [name] Care Management [facility] CM Assistant Phone: [phone] Fax: [phone]

## 2015-09-21 ENCOUNTER — Other Ambulatory Visit: Payer: Self-pay | Admitting: *Deleted

## 2015-09-21 NOTE — Patient Outreach (Signed)
[facility]N) Care Management  09/21/2015  [name] [birth year] [id number]   Phone call from patient's daughter [employer] [facility](POA) with general questions regarding Medicaid and insurance coverage. She further discussed patient's current  placement [name] and not being sure of patient's long term plan. She discussed the expense of private duty care and really not being sure if patient could afford long term care.  Per patient's daughter, patient was declined for Medicaid.      This social worker encouraged patient's daughter to contact [employer] Health Insurance Information Program(SHIP) for information regarding Medicare health care products. This social worker also suggested that daughter look into long term care Medicaid. This social worker explained [facility] services and need to complete an initial assessment.  This Child psychotherapistsocial worker offered to meet patient and patient's daughter at [name] to open patient's case, sign consent as well as to be involved in patient's discharge and long term care planning.  Patient's daughter stated that she did not know when she would be back out to [name] and would call this social worker back to schedule a date and time to begin services.    Adriana ReamsChrystal Land, [name] [facility] Care Management [phone]

## 2015-10-01 ENCOUNTER — Other Ambulatory Visit: Payer: Self-pay | Admitting: *Deleted

## 2015-10-01 NOTE — Patient Outreach (Signed)
[facility]N) Care Management  10/01/2015  [name] 1927-10-19 [id number]   Phone call to patient's daughter to see if social work assistance is still needed.  Voicemail message left for a return call.    Elizabeth ReamsChrystal Land, [name] [facility] Care Management [phone]

## 2015-10-03 ENCOUNTER — Other Ambulatory Visit: Payer: Self-pay | Admitting: *Deleted

## 2015-10-03 NOTE — Patient Outreach (Signed)
[facility]N) Care Management  10/03/2015  Juanito Doomancy K Emley 08-05-1927 [id number]  Return phone call from patient's daughter stating that she did want social work involvement, however would like to open patient's case when patient returned home.  Patient's daughter has declined long term care for patient and plans on bring patient home on 10/05/15.   This Child psychotherapistsocial worker will plan to follow up with patient and open her to [facility] case management upon her return home.   Adriana ReamsChrystal Isaih Bulger, [name] [facility] Care Management [phone]

## 2015-10-10 ENCOUNTER — Other Ambulatory Visit: Payer: Self-pay | Admitting: *Deleted

## 2015-10-10 NOTE — Patient Outreach (Signed)
[facility]N) Care Management  10/10/2015  [name] 1927/05/23 [id number]   Phone call from patient's daughter Bernestine AmassJane Barbie who is now interested in [facility] services at this time.  Per patient's daughter, patient is still at [name] skilled nursing facility and she would like assistance with developing a discharge plan.  Patient's daughter also interested in more information regarding long term care Medicaid.  Patient's daughter agreed to completing assessment by phone on 10/12/15 at [facility].   Lavin Petteway Lonn GeorgiaLand, [name] [facility] Care Management [phone]

## 2015-10-12 ENCOUNTER — Other Ambulatory Visit: Payer: Self-pay | Admitting: *Deleted

## 2015-10-12 NOTE — Patient Outreach (Signed)
[facility]N) Care Management  10/12/2015  [name] June 26, 1927 [id number]   Spoke with patient's daughter,Power of Elizabeth GravesAttorney [name]. [facility]. Patient has an aid ([facility]) that comes for 3 hours a day 7 days a week to assist with- sponge bath, dressing and a small meal.  Patient's daughter provides the care for the rest of the day after the aid leaves.  Between 6:30 and 7:00pm, friend of the family  would come to assist with patient, however assistance is not consistent.  Limited family support described, Ms. [name] has beenthe main caretaker for the last 6 years.  January 2016, applied for Medicaid, did not follow up with application because she felt that patient would not qualify due to her income so application was denied.  [facility] presented several times to patient but has adamantly declined.  Ms. [name] spoke  with long term medicaid social worker from the Department of Social Services who recommends that patient apply for long term Medicaid and to consult with an elder law attorney  Ms. Arnold overwhelmed with care giving responsibilities and decision  for long term care versus bringing patient home.  However she voice understanding that facility care is  best place for her safety wise.  Per Ms. Arnold, she has spoke with [facility] who states that patient is at maximum assistance, she cannot stand at all and is not progressing.    Plan: This social worker [facility]15/15 and will speak to discharge planner regarding discharge plan.   Adriana ReamsChrystal Land, [name] [facility] Care Management [phone]

## 2015-10-15 ENCOUNTER — Other Ambulatory Visit: Payer: Self-pay | Admitting: *Deleted

## 2015-10-15 NOTE — Patient Outreach (Signed)
[facility]) Care Management  [facility] Social Work  10/15/2015  CLOIS MONTAVON September 16, 1927 [id number]  Subjective:  Patient is an 81 year old female  Objective:   Current Medications:  Current Outpatient Prescriptions  Medication Sig Dispense Refill  . alendronate (FOSAMAX) 70 MG tablet Take 70 mg by mouth every Sunday. Take with a full glass of water on an empty stomach.    . ALPRAZolam (XANAX) 0.5 MG tablet Take 1 tablet (0.5 mg total) by mouth 2 (two) times daily. 30 tablet 0  . amLODipine (NORVASC) 5 MG tablet Take 5 mg by mouth daily.    Marland Kitchen aspirin 325 MG EC tablet Take 325 mg by mouth daily.    . chlorpheniramine (CHLORPHEN) 4 MG tablet Take 8 mg by mouth 2 (two) times daily.     Marland Kitchen Dextromethorphan-Guaifenesin (MUCUS RELIEF DM COUGH PO) Take 1 tablet by mouth at bedtime.     Marland Kitchen doxycycline (DORYX) 100 MG EC tablet Take 1 tablet (100 mg total) by mouth daily. Resume on 09/22/2015    . enoxaparin (LOVENOX) 30 MG/0.3ML injection Inject 0.3 mLs (30 mg total) into the skin daily.    . feeding supplement, ENSURE ENLIVE, (ENSURE ENLIVE) LIQD Take 237 mLs by mouth 2 (two) times daily between meals. 237 mL 12  . lansoprazole (PREVACID) 30 MG capsule Take 30 mg by mouth daily at 12 noon.    Marland Kitchen losartan-hydrochlorothiazide (HYZAAR) 100-25 MG tablet Take 1 tablet by mouth daily.    . Multiple Vitamin (MULTIVITAMIN WITH MINERALS) TABS tablet Take 1 tablet by mouth daily.    . traMADol (ULTRAM) 50 MG tablet Take 1 tablet (50 mg total) by mouth 2 (two) times daily. 30 tablet 0  . traZODone (DESYREL) 100 MG tablet Take 100 mg by mouth at bedtime.     No current facility-administered medications for this visit.    Functional Status:  In your present state of health, do you have any difficulty performing the following activities: 10/12/2015 09/13/2015  Hearing? Y N  Vision? N N  Difficulty concentrating or making decisions? [name]  Walking or climbing stairs? Y Y  Dressing or  bathing? Y Y  Doing errands, shopping? [name]  Preparing Food and eating ? Y -  Using the Toilet? Y -  In the past six months, have you accidently leaked urine? Y -  Do you have problems with loss of bowel control? Y -  Managing your Medications? Y -  Managing your Finances? Y -  Housekeeping or managing your Housekeeping? Y -    Fall/Depression Screening:  No flowsheet data found.  Assessment:  This Education officer, museum met with discharge planner at [employer] .  Per Roderic Ovens, patient last covered day is 10/17/15 and patient will have to discharge home or apply for long term Medicaid. Patient's minmal progress in rehabilitation discussed.  Discharge planner discussed that [facility] will be arranged for patient.  Increasing private duty hours at home discussed as well.  Patient currently receives assistance from a private duty aid 3 hours a day 7 days a week.   This Education officer, museum discussed concerns regarding daughter's ability to provide care for patient on an ongoing basis outside of those hours, especially due to poor progress in treatment.  This Education officer, museum informed that [name] does accept Medicaid for long term care and that caregiver would need to discuss long term options with her before patient's  discharged.  This social worker was unable to see patient, as she was  in physical therapy at the time of this social workers visit.  Thi Education officer, museum contacted patient's daughter Langston Masker by phone, informed her of tentative discharge date.  Per patient's daughter, she cannot afford long term care for patient and would have to bring patient home.      Plan: This social worker will visit patient on 10/16/15 to discuss long term care plans.

## 2015-10-16 ENCOUNTER — Other Ambulatory Visit: Payer: Self-pay | Admitting: *Deleted

## 2015-10-16 NOTE — Patient Outreach (Addendum)
[facility]) Care Management  10/16/2015  Juanito Doomancy K Nest 07/06/1927 [id number]   Phone call from patient's daughter Bernestine AmassJane Barbie who reports phone [facility] received [facility]  Patient's progress in treatment discussed and her caregiving needs.  Per patient's daughter, she was told that she could not provide care for patient on her own.  Per patient's daughter, she is now rethinking her decision to bring patient home and will talk with her sister and patient about this when they visit today.  She will inform this social worker and discharge planner of final decision on 10/17/15.  Patient's daughter also plans to contact the Elder Law attorney-Arrington previously consulted tomorrow.   Adriana ReamsChrystal Land, [name] [facility] Care Management [phone]

## 2015-10-16 NOTE — Patient Outreach (Signed)
[facility]) Care Management  [facility] Social Work  10/16/2015  [name] 11-17-1927 [id number]  Subjective:  Patient is a 81 year old female currently in [name] skilled nursing facility  Objective:   Current Medications:  Current Outpatient Prescriptions  Medication Sig Dispense Refill  . alendronate (FOSAMAX) 70 MG tablet Take 70 mg by mouth every Sunday. Take with a full glass of water on an empty stomach.    . ALPRAZolam (XANAX) 0.5 MG tablet Take 1 tablet (0.5 mg total) by mouth 2 (two) times daily. 30 tablet 0  . amLODipine (NORVASC) 5 MG tablet Take 5 mg by mouth daily.    Marland Kitchen aspirin 325 MG EC tablet Take 325 mg by mouth daily.    . chlorpheniramine (CHLORPHEN) 4 MG tablet Take 8 mg by mouth 2 (two) times daily.     Marland Kitchen Dextromethorphan-Guaifenesin (MUCUS RELIEF DM COUGH PO) Take 1 tablet by mouth at bedtime.     Marland Kitchen doxycycline (DORYX) 100 MG EC tablet Take 1 tablet (100 mg total) by mouth daily. Resume on 09/22/2015    . enoxaparin (LOVENOX) 30 MG/0.3ML injection Inject 0.3 mLs (30 mg total) into the skin daily.    . feeding supplement, ENSURE ENLIVE, (ENSURE ENLIVE) LIQD Take 237 mLs by mouth 2 (two) times daily between meals. 237 mL 12  . lansoprazole (PREVACID) 30 MG capsule Take 30 mg by mouth daily at 12 noon.    Marland Kitchen losartan-hydrochlorothiazide (HYZAAR) 100-25 MG tablet Take 1 tablet by mouth daily.    . Multiple Vitamin (MULTIVITAMIN WITH MINERALS) TABS tablet Take 1 tablet by mouth daily.    . traMADol (ULTRAM) 50 MG tablet Take 1 tablet (50 mg total) by mouth 2 (two) times daily. 30 tablet 0  . traZODone (DESYREL) 100 MG tablet Take 100 mg by mouth at bedtime.     No current facility-administered medications for this visit.    Functional Status:  In your present state of health, do you have any difficulty performing the following activities: 10/12/2015 09/13/2015  Hearing? Y N  Vision? N N  Difficulty concentrating or making decisions? [name]   Walking or climbing stairs? Y Y  Dressing or bathing? Y Y  Doing errands, shopping? [name]  Preparing Food and eating ? Y -  Using the Toilet? Y -  In the past six months, have you accidently leaked urine? Y -  Do you have problems with loss of bowel control? Y -  Managing your Medications? Y -  Managing your Finances? Y -  Housekeeping or managing your Housekeeping? Y -    Fall/Depression Screening:  PHQ 2/9 Scores 10/16/2015  PHQ - 2 Score 1    Assessment:  This Education officer, museum met with patient to discuss long term care needs.  Patient was in bed when this social worker arrived.  Patient discussed increased difficulty making the decision to remain in the facility or return home.  Patient discussed her understanding of the slow progress made while in the rehabilitation.  She acknowledged  the  limited support at home and not wanting to be a burden to her daughter. Patient's affect depressed, admitted to being in conflict and would like her children to make the decision for her.  This social worker spoke to discharge [name], who stated that the recommendation for patient was long term care based on the therapy progress reports and the amount of care that she will need at home. Per [name], she will call patient's daughter  to discuss recommendations.   Plan:  This Education officer, museum will discuss long term plan with patient's daughter [name].     [facility] CM Care Plan Problem One        Most Recent Value   Care Plan Problem One  patient currently in skilled nursing facility   Role Documenting the Problem One  Clinical Social Worker   Care Plan for Problem One  Active   [facility] CM Short Term Goal #1 (0-30 days)  [name], patient and POA to work with discharge planner to determine long term plan within 30 days   [facility] CM Short Term Goal #1 Start Date  10/09/15   Interventions for Short Term Goal #1  [name] discussed discussed long term plan with discharge planner, patient and POA.  Encouraged  patient to discuss thoughts regrding long term plan with family     [name] [facility] Care Management [phone]

## 2015-10-16 NOTE — Patient Outreach (Signed)
[facility]) Care Management  10/16/2015  [name] [birth year] [id number]    Phone call to patient's daughter Elizabeth AmassJane Arnold to discuss visit with patient today.  Long term care recommendation discussed.  Per patient's daughter, she does not want to see patient living in facility care and would like to try to maintain patient at home.  Increased care giving needs discussed as welll as concern for patient's safety due to her limited progress in treatment.  Per patient's daughter, decision made to care for patient at home.  She will discuss this decision with discharge planner at [name].    Elizabeth ReamsChrystal Sona Nations, [name] [facility] Care Management [phone]

## 2015-10-17 ENCOUNTER — Other Ambulatory Visit: Payer: Self-pay | Admitting: *Deleted

## 2015-10-17 NOTE — Patient Outreach (Signed)
[facility]N) Care Management  10/17/2015  [name] 13-Jan-1927 [id number]   Phone call from patient's daughter Elizabeth AmassJane Arnold, stating that she has decided on long term care for patient based on patient's need for total care.  She will be meeting with Elder Law Elizabeth LeavenAttorney [name] today at 2:00pm to discuss long term Medicaid process.   Patient's [facility] due to decision made for patient to remain [name]  under long term care.    Elizabeth ReamsChrystal Luisa Louk, [name] [facility] Care Management [phone]

## 2015-10-17 NOTE — Patient Outreach (Signed)
[facility]) Care Management  10/17/2015  [name] 02-10-1927 [id number]   Notification from [facility], [name] to close case due to goals met with [name].  Thanks, [name]. [place], [place] Assistant Phone: [phone] Fax: [phone]

## 2016-02-06 DEATH — deceased

## 2017-09-13 IMAGING — DX DG CHEST 2V
2 series · 2 of 2 positions shown · non-contrast
Comparison: July 03, 2006

CLINICAL DATA: Shortness of breath for several days.  Hypertension.

EXAM:
CHEST  2 VIEW

[x chest ap]
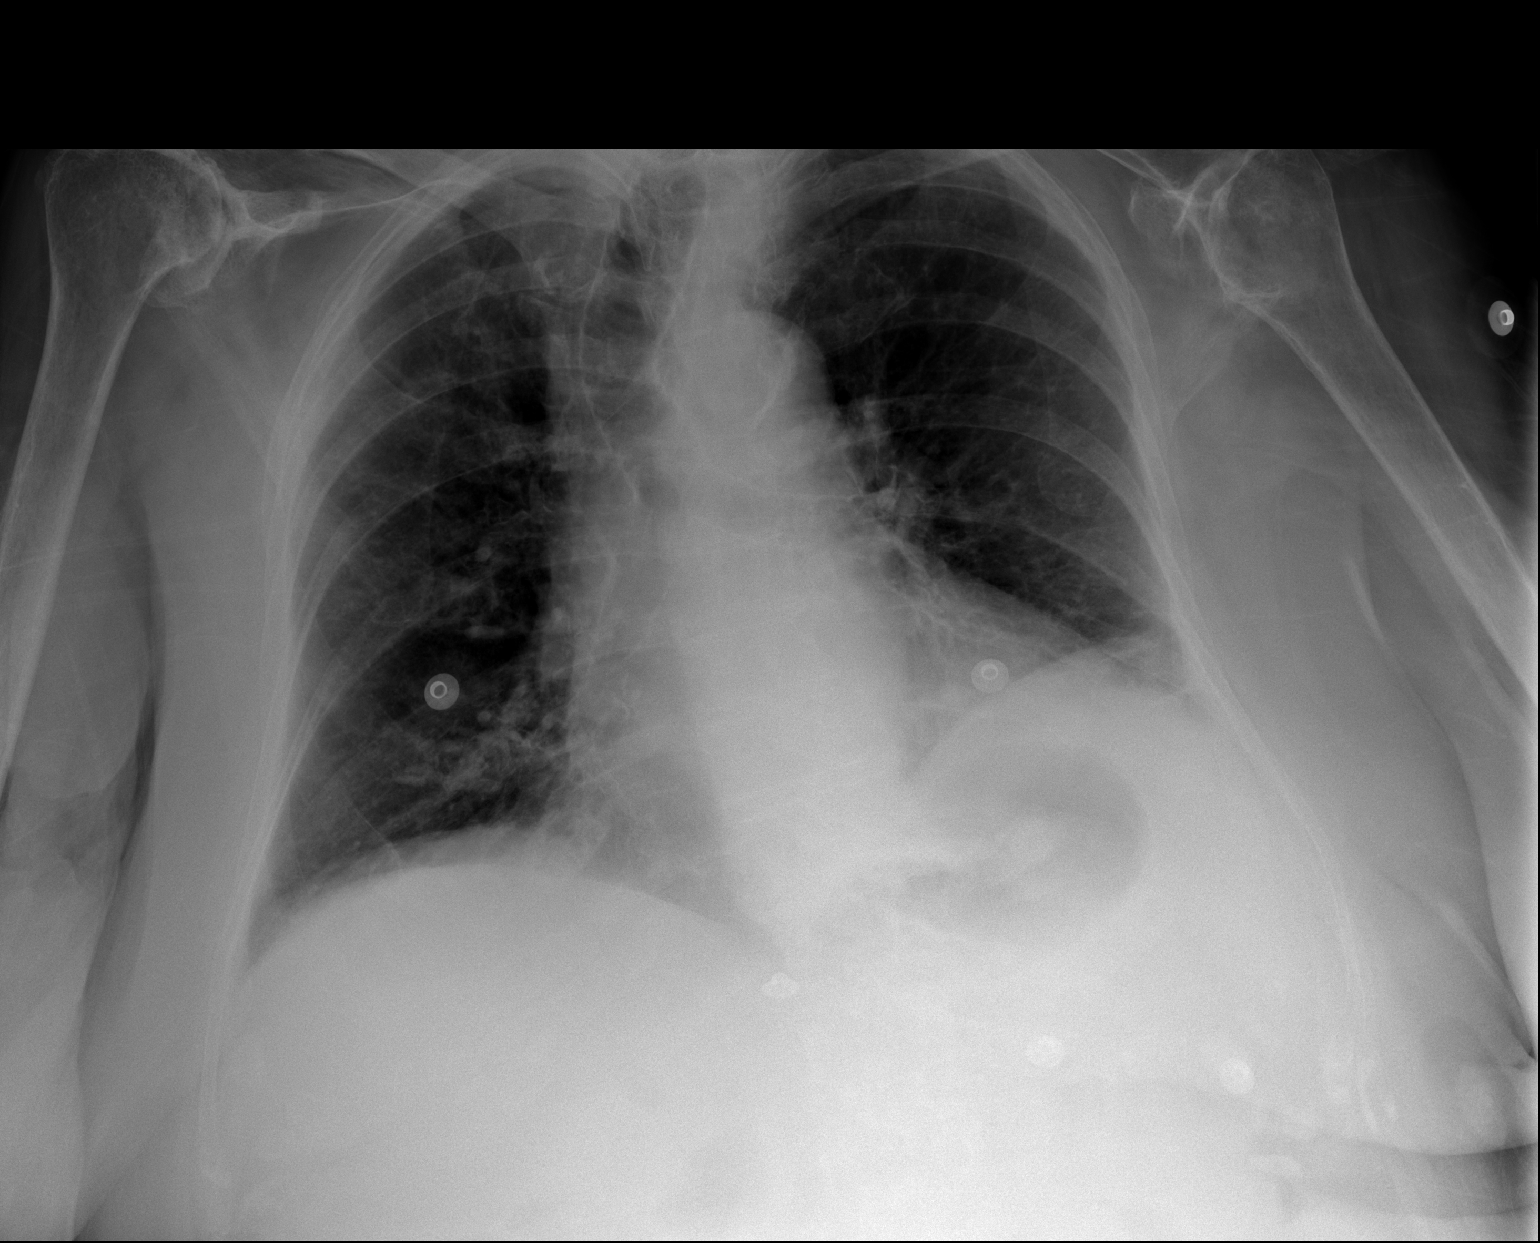

[w chest lat]
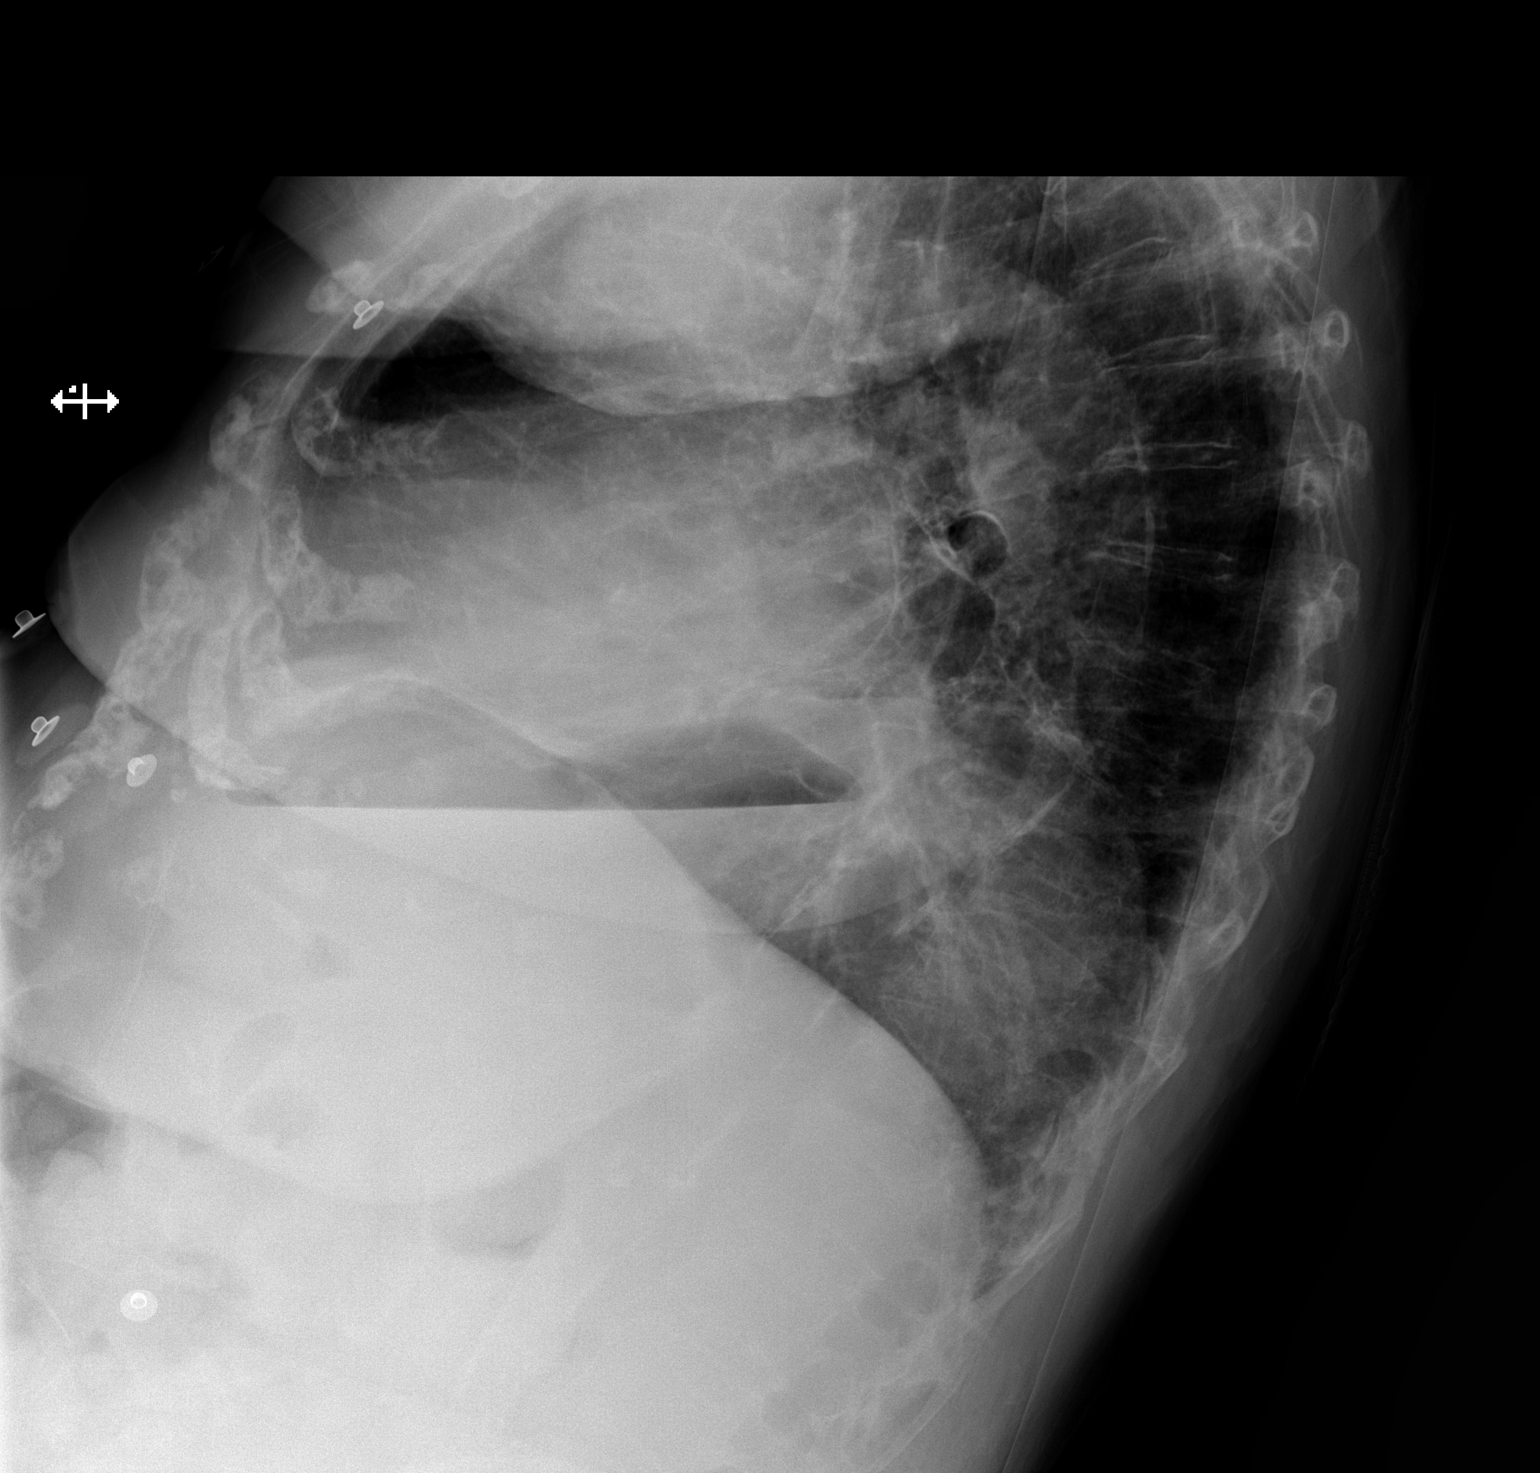

[2 of 2 positions shown; findings below may reference images not displayed]

FINDINGS: There is mild elevation of the left hemidiaphragm. There is
atelectatic change in the left base. The lungs are otherwise clear.
Heart size and pulmonary vascularity are normal. There is
atherosclerotic change in the aorta. No adenopathy. There is
extensive arthropathy in both shoulders with avascular necrosis in
the left humeral head. There are chronic rotator cuff tears
bilaterally. There is thoracolumbar levoscoliosis.
IMPRESSION: Atelectasis left base. No edema or consolidation. Heart size within
normal limits. Extensive arterial vascular calcification. Avascular
necrosis left humeral head. Extensive arthropathy in each shoulder
with chronic rotator cuff tears bilaterally.
